# Patient Record
Sex: Female | Born: 1955
Health system: Southern US, Community
[De-identification: ages and names within clinical notes are randomized; demographics above are authoritative.]

## PROBLEM LIST (undated history)

## (undated) DIAGNOSIS — E669 Obesity, unspecified: Secondary | ICD-10-CM

## (undated) DIAGNOSIS — A609 Anogenital herpesviral infection, unspecified: Secondary | ICD-10-CM

## (undated) DIAGNOSIS — I10 Essential (primary) hypertension: Secondary | ICD-10-CM

## (undated) DIAGNOSIS — E119 Type 2 diabetes mellitus without complications: Secondary | ICD-10-CM

## (undated) DIAGNOSIS — F32A Depression, unspecified: Secondary | ICD-10-CM

## (undated) DIAGNOSIS — F329 Major depressive disorder, single episode, unspecified: Secondary | ICD-10-CM

## (undated) DIAGNOSIS — E1169 Type 2 diabetes mellitus with other specified complication: Secondary | ICD-10-CM

## (undated) DIAGNOSIS — E785 Hyperlipidemia, unspecified: Secondary | ICD-10-CM

## (undated) DIAGNOSIS — I341 Nonrheumatic mitral (valve) prolapse: Secondary | ICD-10-CM

## (undated) DIAGNOSIS — G4700 Insomnia, unspecified: Secondary | ICD-10-CM

## (undated) DIAGNOSIS — M5416 Radiculopathy, lumbar region: Secondary | ICD-10-CM

## (undated) DIAGNOSIS — F419 Anxiety disorder, unspecified: Secondary | ICD-10-CM

## (undated) HISTORY — DX: Type 2 diabetes mellitus without complications: E11.9

## (undated) HISTORY — DX: Insomnia, unspecified: G47.00

## (undated) HISTORY — DX: Major depressive disorder, single episode, unspecified: F32.9

## (undated) HISTORY — DX: Essential (primary) hypertension: I10

## (undated) HISTORY — DX: Hyperlipidemia, unspecified: E78.5

## (undated) HISTORY — DX: Radiculopathy, lumbar region: M54.16

## (undated) HISTORY — DX: Nonrheumatic mitral (valve) prolapse: I34.1

## (undated) HISTORY — DX: Depression, unspecified: F32.A

## (undated) HISTORY — DX: Obesity, unspecified: E66.9

## (undated) HISTORY — DX: Anxiety disorder, unspecified: F41.9

## (undated) HISTORY — DX: Anogenital herpesviral infection, unspecified: A60.9

## (undated) HISTORY — DX: Type 2 diabetes mellitus with other specified complication: E11.69

---

## 2000-02-04 ENCOUNTER — Other Ambulatory Visit: Admission: RE | Admit: 2000-02-04 | Discharge: 2000-02-04 | Payer: Self-pay | Admitting: Obstetrics and Gynecology

## 2000-02-13 ENCOUNTER — Encounter: Payer: Self-pay | Admitting: Obstetrics and Gynecology

## 2000-02-13 ENCOUNTER — Encounter: Admission: RE | Admit: 2000-02-13 | Discharge: 2000-02-13 | Payer: Self-pay | Admitting: Obstetrics and Gynecology

## 2000-04-20 ENCOUNTER — Encounter (INDEPENDENT_AMBULATORY_CARE_PROVIDER_SITE_OTHER): Payer: Self-pay

## 2000-04-20 ENCOUNTER — Inpatient Hospital Stay (HOSPITAL_COMMUNITY): Admission: RE | Admit: 2000-04-20 | Discharge: 2000-04-21 | Payer: Self-pay | Admitting: *Deleted

## 2001-10-27 ENCOUNTER — Other Ambulatory Visit: Admission: RE | Admit: 2001-10-27 | Discharge: 2001-10-27 | Payer: Self-pay | Admitting: *Deleted

## 2002-01-18 ENCOUNTER — Encounter: Admission: RE | Admit: 2002-01-18 | Discharge: 2002-01-18 | Payer: Self-pay | Admitting: Family Medicine

## 2002-01-18 ENCOUNTER — Encounter: Payer: Self-pay | Admitting: Family Medicine

## 2002-01-23 ENCOUNTER — Encounter: Payer: Self-pay | Admitting: Family Medicine

## 2002-01-23 ENCOUNTER — Encounter: Admission: RE | Admit: 2002-01-23 | Discharge: 2002-01-23 | Payer: Self-pay | Admitting: Family Medicine

## 2003-06-05 ENCOUNTER — Other Ambulatory Visit: Admission: RE | Admit: 2003-06-05 | Discharge: 2003-06-05 | Payer: Self-pay | Admitting: *Deleted

## 2003-06-15 ENCOUNTER — Encounter: Admission: RE | Admit: 2003-06-15 | Discharge: 2003-06-15 | Payer: Self-pay | Admitting: Family Medicine

## 2003-08-24 ENCOUNTER — Encounter: Admission: RE | Admit: 2003-08-24 | Discharge: 2003-08-24 | Payer: Self-pay | Admitting: Family Medicine

## 2004-03-27 ENCOUNTER — Other Ambulatory Visit: Admission: RE | Admit: 2004-03-27 | Discharge: 2004-03-27 | Payer: Self-pay | Admitting: *Deleted

## 2004-06-24 ENCOUNTER — Encounter: Admission: RE | Admit: 2004-06-24 | Discharge: 2004-06-24 | Payer: Self-pay | Admitting: Family Medicine

## 2005-03-26 ENCOUNTER — Other Ambulatory Visit: Admission: RE | Admit: 2005-03-26 | Discharge: 2005-03-26 | Payer: Self-pay | Admitting: *Deleted

## 2006-03-31 ENCOUNTER — Other Ambulatory Visit: Admission: RE | Admit: 2006-03-31 | Discharge: 2006-03-31 | Payer: Self-pay | Admitting: *Deleted

## 2006-05-14 ENCOUNTER — Encounter: Admission: RE | Admit: 2006-05-14 | Discharge: 2006-05-14 | Payer: Self-pay | Admitting: Family Medicine

## 2007-03-29 ENCOUNTER — Other Ambulatory Visit: Admission: RE | Admit: 2007-03-29 | Discharge: 2007-03-29 | Payer: Self-pay | Admitting: *Deleted

## 2007-05-17 ENCOUNTER — Encounter: Admission: RE | Admit: 2007-05-17 | Discharge: 2007-05-17 | Payer: Self-pay | Admitting: Family Medicine

## 2008-03-28 ENCOUNTER — Other Ambulatory Visit: Admission: RE | Admit: 2008-03-28 | Discharge: 2008-03-28 | Payer: Self-pay | Admitting: Gynecology

## 2008-05-17 ENCOUNTER — Encounter: Admission: RE | Admit: 2008-05-17 | Discharge: 2008-05-17 | Payer: Self-pay | Admitting: Family Medicine

## 2009-05-22 ENCOUNTER — Encounter: Admission: RE | Admit: 2009-05-22 | Discharge: 2009-05-22 | Payer: Self-pay | Admitting: Family Medicine

## 2010-06-04 ENCOUNTER — Encounter: Admission: RE | Admit: 2010-06-04 | Discharge: 2010-06-04 | Payer: Self-pay | Admitting: Family Medicine

## 2010-11-21 NOTE — Op Note (Signed)
[facility]  Patient:    Tina Townsend, Tina Townsend             MRN: [id number] Proc. Date: 04/20/00 Adm. Date:  [id number] Attending:  Collene Schlichter CC:         Harl Bowie, M.D.  Yaakov Guthrie. Shon Hough, M.D.   Operative Report  PREOPERATIVE DIAGNOSES:  Abnormal uterine bleeding, leiomyoma uteri, history of pelvic adhesions, umbilical hernia, diastasis recti.  POSTOPERATIVE DIAGNOSIS:  Abnormal uterine bleeding, leiomyoma uteri, history of pelvic adhesions, umbilical hernia, diastasis recti.  Extensive pelvic adhesions, probable endometriosis.  OPERATIONS:  Total abdominal hysterectomy, bilateral salpingo-oophorectomy, extensive lysis of adhesions.  SURGEON:  Almedia Balls. Randell Patient, M.D.  ANESTHESIA:  General orotracheal.  FIRST ASSISTANT:  Harl Bowie, M.D.  INDICATIONS:  The patient is a 55 year old with the above noted problems who was counseled as to the need for surgery to correct these problems.  She was fully counseled as to the nature of the procedure and the risks involved to include risk of anesthesia, injury to bowel, bladder, blood vessels, ureters, postoperative hemorrhage, infection, recuperation, and possibility of hormone replacement should her ovaries be removed.  She fully understands all these considerations and wishes to proceed on 20 April 2000.  As noted above, she also had the problems with her abdominal wall which will be repaired by Dr. Shon Hough.  OPERATIVE FINDINGS:  Upon entry into the abdomen there were dense adhesions involving loops of bowel to the posterior surface of the uterus and to both adnexal structures.  There was an umbilical hernia noted with separation of the rectus muscles as well.  Exploration of the upper abdomen revealed the liver to be normal.  No abnormalities in the area of the gallbladder, kidneys, spleen, or periaortic areas.  DESCRIPTION OF PROCEDURE:  With the patient under general  anesthesia, prepared and draped in the usual sterile fashion, with a Foley catheter in the bladder, a lower abdominal transverse incision was made after excising a previous surgical scar.  The incision was carried into the peritoneal cavity without difficulty.  Placement of the lateral blades of self-retaining retractor was possible only because of the dense adhesions involving the anterior and posterior surface of the uterus as well as the adnexal structures.  The adhesions were lysed using sharp and blunt dissection with elevation of the uterine fundus which was enlarged to approximately 12-[redacted] weeks gestational size and irregular.  Both ovaries were likewise adherent to loops of bowel. There were areas of what appeared to be endometriosis on the serosal surface of the uterus, particularly in the left adnexal structure area.  After freeing the uterine fundus from the loops of bowel, Heaney clamps were placed at the uteroovarian anastomosis, tubes and round ligaments; these structures were then cut free and ligated with one chromic catgut.  It was impossible to transect the round ligaments with suture ligatures being placed laterally to the area of transection.  Bladder flap was then developed on the anterior surface of the uterus.  Uterine vessels were then skeletonized, clamped, cut, and suture ligated with one chromic catgut.  The uterine fundus was then excised by using Bovie electrocoagulation to transect the lower uterine segment and cervix.  Bleeders were rendered hemostatic with interrupted sutures and vascular clips.  The uterine fundus was excised, and the cervical stump was reapproximated and rendered hemostatic with interrupted sutures of #1 chromic catgut.  Attention was then directed to the ovaries.  Both ovaries were quite adherent to loops of bowel  and it was felt that leaving these intact would impose a threat to the patient later.  Accordingly, sharp and blunt dissection  was used to free the ovaries from their adhesions with the identification of the infundibulopelvic ligaments bilaterally; these structures were clamped, cut, and doubly ligated with one chromic catgut.  The bowel adhesions were carefully inspected to ensure that all areas were hemostatic.  The adhesions on the right had required vascular clips to render hemostasis.  The area was then lavaged with copious amounts of lactated Ringers solution, and the area overlying the cervix was reperitonealized with interrupted sutures of one chromic catgut.  With correct sponge and instrument count and good hemostasis, the peritoneum was closed with continuous suture of 0 Vicryl.  Dr. Shon Hough was to repair the abdominal wall defect at this point.  Estimated blood loss for our portion of the procedure was 400 mL and the patient was in good condition at the time we left the operating room. DD:  04/20/00 TD:  04/20/00 Job: [id number]

## 2010-11-21 NOTE — H&P (Signed)
[facility]  Patient:    Tina Townsend, Tina Townsend                     MRN: [id number] Adm. Date:  04/20/00 Attending:  Almedia Balls. Randell Patient, M.D.                         History and Physical  CHIEF COMPLAINT:  Fibroids, umbilical hernia.  HISTORY OF PRESENT ILLNESS:  Patient is a 55 year old gravida 4, para 2, whose last menstrual period was 18 March 2000.  She had normal Pap smear in July 2001.  She has been seen in Dr. Jonathon Bellows office over the past several months and ultrasound was done in early August 2001 with an indication of multiple myomata present.  The patient has had repeated abnormal menses with heavy flow in which she changes protection at least every 45 minutes.  The endometrium appeared to be normal on the ultrasound done in August and was quite regular except for indentations from fibroids.  She also has a history of marked pelvic adhesions.  She is admitted at this time for total abdominal hysterectomy, possible bilateral salpingo-oophorectomy.  She is also to be operated upon by Dr. Shon Hough for a large umbilical hernia which causes her severe distress with temporary incarceration of bowel.  She has been fully counseled as to the type of procedure to be performed and the risks involved include risks of anesthesia, injury to bowel, bladder, blood vessels, ureters, postoperative hemorrhage, infection, recuperation, possibility of hormone replacement, should her ovaries be removed.  PAST MEDICAL HISTORY:  Includes ectopic pregnancy, right, in 1987, left partial salpingectomy in 1995 by Dr. Dewaine Conger with findings of extensive pelvic adhesions.  She has no serious medical problems except for heart murmur, for which she takes antibiotics prior to operative procedures.  ALLERGIES:  She is allergic to no medications and takes no medications regularly.  FAMILY HISTORY:  Mother with hypertension, mother and two siblings with diabetes mellitus.  REVIEW  OF SYSTEMS:  HEENT:  Negative.  CARDIORESPIRATORY:  As noted above. GASTROINTESTINAL:  Negative.  GENITOURINARY:  As noted above.  PHYSICAL EXAMINATION:  VITAL SIGNS:  Height 5 feet 4-1/4 inches, weight 164 pounds, blood pressure 118/76, pulse 80, respirations 18.  GENERAL:  Well-developed black female in no acute distress.  HEENT:  Within normal limits.  NECK:  Supple without masses, adenopathy, or bruits.  HEART:  Regular rate and rhythm without murmurs.  LUNGS:  Clear to P&A.  BREASTS:  Examined sitting and lying without mass.  Axillae negative.  ABDOMEN:  Flat and soft with irregular mass effect in the lower abdomen approximately two finger breadths below the umbilicus.  PELVIC:  External genitalia:  Bartholins, urethral, and Skenes glands within normal limits.  Cervix slightly inflamed.  Uterus _________ , and approximately [redacted] weeks gestational size.  With straining the patient demonstrates defect in the umbilicus representing an umbilical hernia in this area.  It is not possible to palpate adnexal masses, but ultrasound recently has been normal.  Anterior and posterior cul-de-sac exam is confirmatory.  EXTREMITIES:  Within normal limits.  CENTRAL NERVOUS SYSTEM:  Grossly intact.  SKIN:  Without suspicious lesions.  IMPRESSION: 1. Leiomyomata uteri. 2. Abnormal uterine bleeding. 3. History of pelvic adhesions. 4. Umbilical hernia.  DISPOSITION:  As noted above. DD:  04/08/00 TD:  04/08/00 Job: [id number]

## 2010-11-21 NOTE — Op Note (Signed)
[place]  Patient:    Tina Townsend, Tina Townsend             MRN: [id number] Adm. Date:  [id number] Attending:  Collene Schlichter                           Operative Report  INDICATIONS:  This is a 55 year old lady who has increased abdominal dermatochalasis, as well as repair of 4+ diastasis recti and umbilical hernia.  PROCEDURES PERFORMED: 1. Repair of diastasis recti. 2. Repair of umbilical hernia with Prolene mesh.  SURGEON:  Yaakov Guthrie. Shon Hough, M.D.  ANESTHESIA:  General.  The patient is also having an associate procedure done by Dr. Randell Patient for abdominal hysterectomy.  DESCRIPTION OF PROCEDURE:  The patient underwent general anesthesia and proper prep after the abdominal hysterectomy portion was done. I then took over.  I extended the transverse incision through laterally on each side and then took a flap of tissue up to the umbilicus. The umbilicus was then released on its pedicle and an obvious umbilical hernia was present. The sac was dissected and the umbilical hernia contents were examined showing omentum. This was placed back into the intra-abdominal cavity. The fascia edges were then sharpened with Metzenbaum scissors. The area was prepared with a 3 x 3 sheeting of Prolene mesh, which was tripled on itself, and plicated with multiple sutures of 0 Surgilon.  Next, the severe diastasis recti was repaired from above the umbilicus down to the suprapubic area using a running locking suture of #1 Prolene. The wound was irrigated after proper hemostasis. The umbilicus was then placed back down into the fascia with a #2 Surgilon suture. Excess tissue of the skin was excised and repair was done with multiple sutures of 2-0 Monocryl x 2 layers in the subcu plane, and then a rather subcuticular stitch of 3-0 Monocryl. The wounds were drained with a #10 Blake drain, which was placed in the depths of the wound and brought out through the  suprapubic hair and secured with 3-0 Prolene. The wounds were cleansed. Steri-Strips and a soft dressing applied to all the areas, as well as an abdominal support.  She withstood the procedures very well and was taken to recovery in good condition.  ESTIMATED BLOOD LOSS:  150 cc.  COMPLICATIONS:  None.DD:  04/20/00 TD:  04/20/00 Job: [id number]

## 2010-11-21 NOTE — Discharge Summary (Signed)
[facility]  Patient:    Tina Townsend, Tina Townsend             MRN: [id number] Adm. Date:  [id number] Disc. Date: [id number] Attending:  Collene Schlichter                           Discharge Summary  HISTORY:  The patient is a 55 year old with abnormal uterine bleeding, uterine enlargement secondary to probably myomata, history of pelvic adhesions, umbilical hernia, and diastasis recti, who was admitted on April 20, 2000, for repair of these problems.  The remainder of her history and physical are as previously dictated.  LABORATORY DATA:  Preoperative hemoglobin 9.9, normal electrolytes, normal urinalysis.  Postoperative hemoglobin 7.4.  HOSPITAL COURSE:  The patient was taken to the operating room on the morning of April 20, 2000, at which time, abdominal supracervical hysterectomy, bilateral salpingo-oophorectomy, extensive lysis of adhesions, repair of umbilical hernia, and diastasis recti by Dr. Shon Hough were performed.  The patient did well postoperatively.  Diet and ambulation were progressed over the evening of October 16 and early morning of October 17.  On the morning of April 21, 2000, she was afebrile and experiencing no problems.  It was felt that she could be discharged at this time.  FINAL DIAGNOSES: 1. Leiomyomata uteri. 2. Abnormal uterine bleeding with anemia secondary to above. 3. Extensive pelvic adhesions. 4. Umbilical hernia. 5. Diastasis recti.  OPERATIONS:  Abdominal supracervical hysterectomy, bilateral salpingo-oophorectomy, extensive lysis of adhesions, repair of umbilical hernia, repair of diastasis recti.  Pathology report is unavailable at the time of dictation.  DISPOSITION:  Discharged home to return to our office in approximately two weeks for follow-up.  She is instructed to gradually progress her activities over several weeks at home and to limit lifting and driving.  She has been instructed by Dr.  Shon Hough to return to his office in approximately two days for follow-up.  DISCHARGE MEDICATIONS: 1. She is given a prescription for Tylox, generic, #30 to be taken 1-2 q.4-6h.    p.r.n. pain. 2. Vivelle patch 0.05 mg to be changed twice weekly.  DISCHARGE INSTRUCTIONS:  She is to call for any problems. DD:  04/21/00 TD:  04/21/00 Job: [id number]

## 2011-05-05 ENCOUNTER — Other Ambulatory Visit: Payer: Self-pay | Admitting: Family Medicine

## 2011-05-05 DIAGNOSIS — Z1231 Encounter for screening mammogram for malignant neoplasm of breast: Secondary | ICD-10-CM

## 2011-06-11 ENCOUNTER — Ambulatory Visit
Admission: RE | Admit: 2011-06-11 | Discharge: 2011-06-11 | Disposition: A | Discharge status: Home / Self Care | Payer: 59 | Source: Ambulatory Visit | Attending: Family Medicine | Admitting: Family Medicine

## 2011-06-11 DIAGNOSIS — Z1231 Encounter for screening mammogram for malignant neoplasm of breast: Secondary | ICD-10-CM

## 2011-06-16 ENCOUNTER — Other Ambulatory Visit: Payer: Self-pay | Admitting: Family Medicine

## 2011-06-16 DIAGNOSIS — R928 Other abnormal and inconclusive findings on diagnostic imaging of breast: Secondary | ICD-10-CM

## 2011-07-01 ENCOUNTER — Ambulatory Visit
Admission: RE | Admit: 2011-07-01 | Discharge: 2011-07-01 | Disposition: A | Discharge status: Home / Self Care | Payer: 59 | Source: Ambulatory Visit | Attending: Family Medicine | Admitting: Family Medicine

## 2011-07-01 DIAGNOSIS — R928 Other abnormal and inconclusive findings on diagnostic imaging of breast: Secondary | ICD-10-CM

## 2011-11-26 ENCOUNTER — Other Ambulatory Visit: Payer: Self-pay | Admitting: Family Medicine

## 2011-11-26 DIAGNOSIS — Z09 Encounter for follow-up examination after completed treatment for conditions other than malignant neoplasm: Secondary | ICD-10-CM

## 2012-01-01 ENCOUNTER — Other Ambulatory Visit: Payer: 59

## 2012-03-02 ENCOUNTER — Ambulatory Visit
Admission: RE | Admit: 2012-03-02 | Discharge: 2012-03-02 | Disposition: A | Discharge status: Home / Self Care | Payer: 59 | Source: Ambulatory Visit | Attending: Family Medicine | Admitting: Family Medicine

## 2012-03-02 DIAGNOSIS — Z09 Encounter for follow-up examination after completed treatment for conditions other than malignant neoplasm: Secondary | ICD-10-CM

## 2012-08-25 ENCOUNTER — Other Ambulatory Visit: Payer: Self-pay | Admitting: Family Medicine

## 2012-08-25 DIAGNOSIS — Z1231 Encounter for screening mammogram for malignant neoplasm of breast: Secondary | ICD-10-CM

## 2012-09-16 ENCOUNTER — Ambulatory Visit
Admission: RE | Admit: 2012-09-16 | Discharge: 2012-09-16 | Disposition: A | Discharge status: Home / Self Care | Payer: 59 | Source: Ambulatory Visit | Attending: Family Medicine | Admitting: Family Medicine

## 2012-09-16 DIAGNOSIS — Z1231 Encounter for screening mammogram for malignant neoplasm of breast: Secondary | ICD-10-CM

## 2013-01-10 DIAGNOSIS — F331 Major depressive disorder, recurrent, moderate: Secondary | ICD-10-CM

## 2013-01-10 DIAGNOSIS — F909 Attention-deficit hyperactivity disorder, unspecified type: Secondary | ICD-10-CM

## 2013-01-20 DIAGNOSIS — R41842 Visuospatial deficit: Secondary | ICD-10-CM

## 2013-01-20 DIAGNOSIS — F331 Major depressive disorder, recurrent, moderate: Secondary | ICD-10-CM

## 2013-01-20 DIAGNOSIS — R41840 Attention and concentration deficit: Secondary | ICD-10-CM

## 2013-03-23 ENCOUNTER — Other Ambulatory Visit: Payer: Self-pay | Admitting: Family Medicine

## 2013-03-23 DIAGNOSIS — IMO0002 Reserved for concepts with insufficient information to code with codable children: Secondary | ICD-10-CM

## 2013-04-15 ENCOUNTER — Other Ambulatory Visit: Payer: 59

## 2013-04-23 ENCOUNTER — Ambulatory Visit
Admission: RE | Admit: 2013-04-23 | Discharge: 2013-04-23 | Disposition: A | Discharge status: Home / Self Care | Payer: 59 | Source: Ambulatory Visit | Attending: Family Medicine | Admitting: Family Medicine

## 2013-04-23 DIAGNOSIS — IMO0002 Reserved for concepts with insufficient information to code with codable children: Secondary | ICD-10-CM

## 2013-05-10 DIAGNOSIS — G5600 Carpal tunnel syndrome, unspecified upper limb: Secondary | ICD-10-CM | POA: Insufficient documentation

## 2013-05-10 DIAGNOSIS — M47812 Spondylosis without myelopathy or radiculopathy, cervical region: Secondary | ICD-10-CM | POA: Insufficient documentation

## 2013-05-10 HISTORY — DX: Carpal tunnel syndrome, unspecified upper limb: G56.00

## 2013-05-10 HISTORY — DX: Spondylosis without myelopathy or radiculopathy, cervical region: M47.812

## 2013-06-08 DIAGNOSIS — M4802 Spinal stenosis, cervical region: Secondary | ICD-10-CM

## 2013-06-08 HISTORY — DX: Spinal stenosis, cervical region: M48.02

## 2014-03-29 ENCOUNTER — Other Ambulatory Visit: Payer: Self-pay | Admitting: Family Medicine

## 2014-03-29 DIAGNOSIS — Z139 Encounter for screening, unspecified: Secondary | ICD-10-CM

## 2014-04-04 ENCOUNTER — Other Ambulatory Visit: Payer: Self-pay | Admitting: Family Medicine

## 2014-04-04 DIAGNOSIS — Z87891 Personal history of nicotine dependence: Secondary | ICD-10-CM

## 2014-04-05 ENCOUNTER — Ambulatory Visit
Admission: RE | Admit: 2014-04-05 | Discharge: 2014-04-05 | Disposition: A | Discharge status: Home / Self Care | Payer: No Typology Code available for payment source | Source: Ambulatory Visit | Attending: Family Medicine | Admitting: Family Medicine

## 2014-04-05 ENCOUNTER — Encounter (INDEPENDENT_AMBULATORY_CARE_PROVIDER_SITE_OTHER): Payer: Self-pay

## 2014-04-05 DIAGNOSIS — Z87891 Personal history of nicotine dependence: Secondary | ICD-10-CM

## 2014-04-19 ENCOUNTER — Encounter: Payer: Self-pay | Admitting: *Deleted

## 2014-10-03 ENCOUNTER — Other Ambulatory Visit: Payer: Self-pay | Admitting: Specialist

## 2014-10-03 DIAGNOSIS — N189 Chronic kidney disease, unspecified: Secondary | ICD-10-CM

## 2014-10-08 ENCOUNTER — Ambulatory Visit
Admission: RE | Admit: 2014-10-08 | Discharge: 2014-10-08 | Disposition: A | Discharge status: Home / Self Care | Payer: 59 | Source: Ambulatory Visit | Attending: Specialist | Admitting: Specialist

## 2014-10-08 DIAGNOSIS — N189 Chronic kidney disease, unspecified: Secondary | ICD-10-CM

## 2015-06-11 ENCOUNTER — Other Ambulatory Visit: Payer: Self-pay | Admitting: Family Medicine

## 2015-06-11 DIAGNOSIS — R911 Solitary pulmonary nodule: Secondary | ICD-10-CM

## 2015-07-03 ENCOUNTER — Ambulatory Visit
Admission: RE | Admit: 2015-07-03 | Discharge: 2015-07-03 | Disposition: A | Discharge status: Home / Self Care | Payer: 59 | Source: Ambulatory Visit | Attending: Family Medicine | Admitting: Family Medicine

## 2015-07-03 DIAGNOSIS — R911 Solitary pulmonary nodule: Secondary | ICD-10-CM

## 2016-02-18 ENCOUNTER — Encounter: Payer: Self-pay | Admitting: Cardiovascular Disease

## 2016-02-21 ENCOUNTER — Encounter: Payer: Self-pay | Admitting: Cardiovascular Disease

## 2016-02-21 ENCOUNTER — Ambulatory Visit (INDEPENDENT_AMBULATORY_CARE_PROVIDER_SITE_OTHER): Payer: 59 | Admitting: Cardiovascular Disease

## 2016-02-21 VITALS — BP 116/72 | HR 92 | Ht 64.0 in | Wt 189.8 lb

## 2016-02-21 DIAGNOSIS — E785 Hyperlipidemia, unspecified: Secondary | ICD-10-CM | POA: Diagnosis not present

## 2016-02-21 DIAGNOSIS — F329 Major depressive disorder, single episode, unspecified: Secondary | ICD-10-CM | POA: Insufficient documentation

## 2016-02-21 DIAGNOSIS — E039 Hypothyroidism, unspecified: Secondary | ICD-10-CM

## 2016-02-21 DIAGNOSIS — E669 Obesity, unspecified: Secondary | ICD-10-CM | POA: Insufficient documentation

## 2016-02-21 DIAGNOSIS — E119 Type 2 diabetes mellitus without complications: Secondary | ICD-10-CM | POA: Insufficient documentation

## 2016-02-21 DIAGNOSIS — F32A Depression, unspecified: Secondary | ICD-10-CM | POA: Insufficient documentation

## 2016-02-21 DIAGNOSIS — R0789 Other chest pain: Secondary | ICD-10-CM

## 2016-02-21 DIAGNOSIS — E1169 Type 2 diabetes mellitus with other specified complication: Secondary | ICD-10-CM

## 2016-02-21 DIAGNOSIS — I341 Nonrheumatic mitral (valve) prolapse: Secondary | ICD-10-CM

## 2016-02-21 HISTORY — DX: Type 2 diabetes mellitus without complications: E11.9

## 2016-02-21 HISTORY — DX: Type 2 diabetes mellitus with other specified complication: E11.69

## 2016-02-21 HISTORY — DX: Nonrheumatic mitral (valve) prolapse: I34.1
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
## 2016-02-21 NOTE — Progress Notes (Signed)
Instructions given

## 2016-02-21 NOTE — Patient Instructions (Addendum)
Medication Instructions:  START ASPIRIN 81 MG DAILY  Labwork: FASTING LP/TSH/FT4 AT [facility]  Testing/Procedures: Your physician has requested that you have en exercise stress myoview. For further information please visit [email or web address]. Please follow instruction sheet, as given.  Follow-Up: Your physician recommends that you schedule a follow-up appointment in: [place]  If you need a refill on your cardiac medications before your next appointment, please call your pharmacy.

## 2016-02-29 LAB — LIPID PANEL
CHOL/HDL RATIO: 2.5 ratio (ref <=5.0)
Cholesterol: 139 mg/dL (ref 125–200)
HDL: 56 mg/dL (ref >=46)
LDL Cholesterol: 71 mg/dL (ref <130)
Triglycerides: 59 mg/dL (ref <150)
VLDL: 12 mg/dL (ref <30)

## 2016-02-29 LAB — TSH: TSH: 4.76 m[IU]/L — AB

## 2016-02-29 LAB — T4, FREE: FREE T4: 1 ng/dL (ref 0.8–1.8)

## 2016-03-04 ENCOUNTER — Telehealth (HOSPITAL_COMMUNITY): Payer: Self-pay

## 2016-03-04 NOTE — Telephone Encounter (Signed)
Encounter complete. 

## 2016-03-05 ENCOUNTER — Telehealth (HOSPITAL_COMMUNITY): Payer: Self-pay

## 2016-03-05 NOTE — Telephone Encounter (Signed)
Encounter complete. 

## 2016-03-06 ENCOUNTER — Ambulatory Visit (HOSPITAL_COMMUNITY)
Admission: RE | Admit: 2016-03-06 | Discharge: 2016-03-06 | Disposition: A | Discharge status: Home / Self Care | Payer: 59 | Source: Ambulatory Visit | Attending: Cardiovascular Disease | Admitting: Cardiovascular Disease

## 2016-03-06 DIAGNOSIS — R5383 Other fatigue: Secondary | ICD-10-CM | POA: Diagnosis not present

## 2016-03-06 DIAGNOSIS — R002 Palpitations: Secondary | ICD-10-CM | POA: Insufficient documentation

## 2016-03-06 DIAGNOSIS — Z8249 Family history of ischemic heart disease and other diseases of the circulatory system: Secondary | ICD-10-CM | POA: Insufficient documentation

## 2016-03-06 DIAGNOSIS — I10 Essential (primary) hypertension: Secondary | ICD-10-CM | POA: Insufficient documentation

## 2016-03-06 DIAGNOSIS — R0609 Other forms of dyspnea: Secondary | ICD-10-CM | POA: Insufficient documentation

## 2016-03-06 DIAGNOSIS — E039 Hypothyroidism, unspecified: Secondary | ICD-10-CM | POA: Insufficient documentation

## 2016-03-06 DIAGNOSIS — E119 Type 2 diabetes mellitus without complications: Secondary | ICD-10-CM | POA: Insufficient documentation

## 2016-03-06 DIAGNOSIS — E669 Obesity, unspecified: Secondary | ICD-10-CM | POA: Insufficient documentation

## 2016-03-06 DIAGNOSIS — R0789 Other chest pain: Secondary | ICD-10-CM | POA: Diagnosis not present

## 2016-03-06 DIAGNOSIS — Z6832 Body mass index (BMI) 32.0-32.9, adult: Secondary | ICD-10-CM | POA: Insufficient documentation

## 2016-03-06 DIAGNOSIS — Z87891 Personal history of nicotine dependence: Secondary | ICD-10-CM | POA: Diagnosis not present

## 2016-03-06 LAB — MYOCARDIAL PERFUSION IMAGING
CHL CUP NUCLEAR SDS: 0
CSEPED: 6 min
CSEPPHR: 150 {beats}/min
Estimated workload: 7 METS
Exercise duration (sec): 0 s
LV sys vol: 13 mL
LVDIAVOL: 55 mL (ref 46–106)
MPHR: 161 {beats}/min
NUC STRESS TID: 1.32
Percent HR: 95 %
RPE: 17
Rest HR: 87 {beats}/min
SRS: 0
SSS: 0

## 2016-03-06 MED ORDER — TECHNETIUM TC 99M TETROFOSMIN IV KIT
29.0000 | PACK | Freq: Once | INTRAVENOUS | Status: AC | PRN
  Administered 2016-03-06: 29 via INTRAVENOUS
  Filled 2016-03-06: qty 29

## 2016-03-06 MED ORDER — TECHNETIUM TC 99M TETROFOSMIN IV KIT
10.2000 | PACK | Freq: Once | INTRAVENOUS | Status: AC | PRN
  Administered 2016-03-06: 10.2 via INTRAVENOUS
  Filled 2016-03-06: qty 10

## 2016-03-10 ENCOUNTER — Telehealth: Payer: Self-pay | Admitting: *Deleted

## 2016-03-10 NOTE — Telephone Encounter (Signed)
-----   Message from Skeet Latch, MD sent at 03/10/2016  4:05 PM EDT ----- Normal stress test

## 2016-03-10 NOTE — Telephone Encounter (Signed)
Advised patient of stress test results  Patient did want Dr Oval Linsey to know that she had started on Glipizide around the time she started having the chest pains. She has stopped the Glipizide and is no longer having chest pains.

## 2016-04-03 ENCOUNTER — Ambulatory Visit: Payer: 59 | Admitting: Cardiovascular Disease

## 2016-04-17 ENCOUNTER — Ambulatory Visit (INDEPENDENT_AMBULATORY_CARE_PROVIDER_SITE_OTHER): Payer: 59 | Admitting: Cardiovascular Disease

## 2016-04-17 ENCOUNTER — Encounter: Payer: Self-pay | Admitting: Cardiovascular Disease

## 2016-04-17 VITALS — BP 123/77 | HR 95 | Ht 64.0 in | Wt 189.8 lb

## 2016-04-17 DIAGNOSIS — R0789 Other chest pain: Secondary | ICD-10-CM

## 2016-04-17 DIAGNOSIS — I10 Essential (primary) hypertension: Secondary | ICD-10-CM | POA: Diagnosis not present

## 2016-04-17 DIAGNOSIS — E7800 Pure hypercholesterolemia, unspecified: Secondary | ICD-10-CM | POA: Diagnosis not present

## 2016-04-17 NOTE — Progress Notes (Signed)
Cardiology Office Note   Date:  04/17/2016   ID:  Lashala, Fohl [birth year], MRN [id number]  PCP:  Marda Stalker, PA-C  Cardiologist:   Skeet Latch, MD   Chief Complaint  Patient presents with  . Follow-up    edema; in ankles if pt does not take a lasix.     History of Present Illness: Tina Townsend is a 60 y.o. female with hypertension and hyperlipidemia who presents for follow up on chest pressure.  Tina Townsend was seen 02/2016 with chest pressure that had been ongoing for the last month. She was referred for exercise Myoview 03/06/16 that was negative for ischemia. LVEF was 76%.  Since that appointment she has been feeling well.  She has very rare episodes of chest pressure.  She stopped taking glipizide and switched to metformin.  She thinks that this may have been the cause of her symptoms.  She also reports having a lot of stress from dealing with her husband who is a disabled veteran.  She hasn't noted any lower extremity edema, orthopnea, or PND. She was also started on levothyroxine for subclinical hypothyroidism.   Past Medical History:  Diagnosis Date  . Anxiety   . Depression   . Diabetes mellitus type 2 in obese ([place]) 02/21/2016  . Diabetes mellitus without complication ([place])   . HSV (herpes simplex virus) anogenital infection   . Hyperlipidemia   . Hypertension   . Insomnia   . Mitral valve prolapse 02/21/2016    No past surgical history on file.   Current Outpatient Prescriptions  Medication Sig Dispense Refill  . ALPRAZolam (XANAX) 0.5 MG tablet Take 0.5 mg by mouth at bedtime as needed for anxiety.    Marland Kitchen aspirin EC 81 MG tablet Take 81 mg by mouth daily.    Marland Kitchen atorvastatin (LIPITOR) 40 MG tablet Take 40 mg by mouth daily.    . BuPROPion HCl (WELLBUTRIN XL PO) Take 450 mg by mouth every morning.    . cholecalciferol (VITAMIN D) 1000 units tablet Take 1,000 Units by mouth daily.    . furosemide (LASIX) 20 MG tablet Take 20 mg by mouth  daily.    Marland Kitchen lisinopril-hydrochlorothiazide (PRINZIDE,ZESTORETIC) 20-12.5 MG per tablet Take 1 tablet by mouth daily.    . Polyethylene Glycol 3350 POWD Take by mouth as needed.    . senna (SENOKOT) 8.6 MG tablet Take 3 tablets by mouth daily.    . Urea 39 % CREA Apply topically 2 (two) times daily.    . valACYclovir (VALTREX) 1000 MG tablet Take 1,000 mg by mouth 2 (two) times daily.     No current facility-administered medications for this visit.     Allergies:   Glipizide    Social History:  The patient  reports that she has quit smoking. She does not have any smokeless tobacco history on file. She reports that she does not drink alcohol or use drugs.   Family History:  The patient's family history includes COPD in her mother; Cancer in her mother and sister; Diabetes in her mother; Heart disease in her mother; Hypertension in her sister.    ROS:  Please see the history of present illness.   Otherwise, review of systems are positive for none.   All other systems are reviewed and negative.    PHYSICAL EXAM: VS:  BP 123/77   Pulse 95   Ht 5\' 4"  (1.626 m)   Wt 189 lb 12.8 oz (86.1 kg)   BMI  32.58 kg/m  , BMI Body mass index is 32.58 kg/m. GENERAL:  Well appearing HEENT:  Pupils equal round and reactive, fundi not visualized, oral mucosa unremarkable NECK:  No jugular venous distention, waveform within normal limits, carotid upstroke brisk and symmetric, no bruits LYMPHATICS:  No cervical adenopathy LUNGS:  Clear to auscultation bilaterally HEART:  RRR.  PMI not displaced or sustained,S1 and S2 within normal limits, no S3, no S4, no clicks, no rubs, no murmurs ABD:  Flat, positive bowel sounds normal in frequency in pitch, no bruits, no rebound, no guarding, no midline pulsatile mass, no hepatomegaly, no splenomegaly EXT:  2 plus pulses throughout, no edema, no cyanosis no clubbing SKIN:  No rashes no nodules NEURO:  Cranial nerves II through XII grossly intact, motor grossly  intact throughout PSYCH:  Cognitively intact, oriented to person place and time    EKG:  EKG is not ordered today. The ekg ordered 02/21/16 demonstrates sinus rhythm. PACs. Rate 92 bpm. Right axis deviation. Cannot rule out prior septal MI.  Exercise Myoveiw 03/06/16:  Nuclear stress EF: 76%.  The left ventricular ejection fraction is hyperdynamic (>65%).  Blood pressure demonstrated a normal response to exercise.  The study is normal.  This is a low risk study.  Recent Labs: 02/28/2016: TSH 4.76   02/19/16: WBC 9.9, hemoglobin 13.5, hematocrit 41.9, platelets 247 TSH 5.29 Hemoglobin A1c 6.2% Sodium 143, potassium 4.4, BUN 17, creatinine 1.29 AST 18, ALT 39  Lipid Panel    Component Value Date/Time   CHOL 139 02/28/2016 0910   TRIG 59 02/28/2016 0910   HDL 56 02/28/2016 0910   CHOLHDL 2.5 02/28/2016 0910   VLDL 12 02/28/2016 0910   LDLCALC 71 02/28/2016 0910      Wt Readings from Last 3 Encounters:  04/17/16 189 lb 12.8 oz (86.1 kg)  03/06/16 189 lb (85.7 kg)  02/21/16 189 lb 12.8 oz (86.1 kg)      ASSESSMENT AND PLAN:  # Chest tightness:  Stress test was negative for ischemia.  Given her hypertension and hyperlipidemia she was started on aspirin 81 mg daily. her diabetes,   # Mitral valve prolapse: Tina Townsend reports a history of mitral valve prolapse. She does not have any evidence of heart failure on exam and it does not sound like she has any mitral regurgitation. Mitral valve prolapse is associated with both chest pain and palpitations.  # Hypertension: Blood pressure is well-controlled.  Continue lisinopril and hydrochlorothiazide.  # Hyperlipidemia: Continue atorvastatin. LDL 71 02/2016.   Current medicines are reviewed at length with the patient today.  The patient does not have concerns regarding medicines.  The following changes have been made:  None   Labs/ tests ordered today include:   No orders of the defined types were placed in this  encounter.    Disposition:   FU with Sura Canul C. Oval Linsey, MD, [facility] as needed.    This note was written with the assistance of speech recognition software.  Please excuse any transcriptional errors.  Signed, Jonhatan Hearty C. Oval Linsey, MD, [facility]  04/17/2016 9:10 AM    [place]

## 2016-04-17 NOTE — Patient Instructions (Signed)
Your stress test was normal.  Please call if you have any new symptoms such as increased chest pressure, shortness of breath or swelling.

## 2016-09-07 ENCOUNTER — Other Ambulatory Visit: Payer: Self-pay | Admitting: Family Medicine

## 2016-09-07 DIAGNOSIS — R911 Solitary pulmonary nodule: Secondary | ICD-10-CM

## 2016-09-18 ENCOUNTER — Ambulatory Visit
Admission: RE | Admit: 2016-09-18 | Discharge: 2016-09-18 | Disposition: A | Discharge status: Home / Self Care | Payer: 59 | Source: Ambulatory Visit | Attending: Family Medicine | Admitting: Family Medicine

## 2016-09-18 DIAGNOSIS — R911 Solitary pulmonary nodule: Secondary | ICD-10-CM

## 2017-12-30 DIAGNOSIS — M6530 Trigger finger, unspecified finger: Secondary | ICD-10-CM

## 2017-12-30 DIAGNOSIS — M5126 Other intervertebral disc displacement, lumbar region: Secondary | ICD-10-CM | POA: Insufficient documentation

## 2017-12-30 HISTORY — DX: Trigger finger, unspecified finger: M65.30

## 2017-12-30 HISTORY — DX: Other intervertebral disc displacement, lumbar region: M51.26

## 2019-07-20 DIAGNOSIS — M7541 Impingement syndrome of right shoulder: Secondary | ICD-10-CM | POA: Insufficient documentation

## 2019-07-20 DIAGNOSIS — M7711 Lateral epicondylitis, right elbow: Secondary | ICD-10-CM

## 2019-07-20 HISTORY — DX: Impingement syndrome of right shoulder: M75.41

## 2019-07-20 HISTORY — DX: Lateral epicondylitis, right elbow: M77.11

## 2020-06-26 DIAGNOSIS — I10 Essential (primary) hypertension: Secondary | ICD-10-CM

## 2020-06-26 DIAGNOSIS — E875 Hyperkalemia: Secondary | ICD-10-CM

## 2020-06-26 DIAGNOSIS — E559 Vitamin D deficiency, unspecified: Secondary | ICD-10-CM | POA: Insufficient documentation

## 2020-06-26 HISTORY — DX: Essential (primary) hypertension: I10

## 2020-06-26 HISTORY — DX: Hyperkalemia: E87.5

## 2020-06-26 HISTORY — DX: Hypercalcemia: E83.52

## 2020-06-26 HISTORY — DX: Vitamin D deficiency, unspecified: E55.9

## 2020-09-24 DIAGNOSIS — M7062 Trochanteric bursitis, left hip: Secondary | ICD-10-CM

## 2020-09-24 DIAGNOSIS — M7061 Trochanteric bursitis, right hip: Secondary | ICD-10-CM

## 2020-09-24 HISTORY — DX: Trochanteric bursitis, left hip: M70.62

## 2020-09-24 HISTORY — DX: Trochanteric bursitis, right hip: M70.61

## 2020-10-10 DIAGNOSIS — F411 Generalized anxiety disorder: Secondary | ICD-10-CM

## 2020-10-10 HISTORY — DX: Generalized anxiety disorder: F41.1

## 2020-10-31 DIAGNOSIS — R911 Solitary pulmonary nodule: Secondary | ICD-10-CM

## 2020-10-31 DIAGNOSIS — K5900 Constipation, unspecified: Secondary | ICD-10-CM | POA: Insufficient documentation

## 2020-10-31 DIAGNOSIS — M25559 Pain in unspecified hip: Secondary | ICD-10-CM | POA: Insufficient documentation

## 2020-10-31 DIAGNOSIS — F909 Attention-deficit hyperactivity disorder, unspecified type: Secondary | ICD-10-CM | POA: Insufficient documentation

## 2020-10-31 DIAGNOSIS — J329 Chronic sinusitis, unspecified: Secondary | ICD-10-CM

## 2020-10-31 DIAGNOSIS — Z860100 Personal history of colon polyps, unspecified: Secondary | ICD-10-CM

## 2020-10-31 DIAGNOSIS — E039 Hypothyroidism, unspecified: Secondary | ICD-10-CM

## 2020-10-31 DIAGNOSIS — K219 Gastro-esophageal reflux disease without esophagitis: Secondary | ICD-10-CM | POA: Insufficient documentation

## 2020-10-31 DIAGNOSIS — M48061 Spinal stenosis, lumbar region without neurogenic claudication: Secondary | ICD-10-CM

## 2020-10-31 HISTORY — DX: Hypothyroidism, unspecified: E03.9

## 2020-10-31 HISTORY — DX: Personal history of colon polyps, unspecified: Z86.0100

## 2020-10-31 HISTORY — DX: Pain in unspecified hip: M25.559

## 2020-10-31 HISTORY — DX: Personal history of colonic polyps: Z86.010

## 2020-10-31 HISTORY — DX: Attention-deficit hyperactivity disorder, unspecified type: F90.9

## 2020-10-31 HISTORY — DX: Solitary pulmonary nodule: R91.1

## 2020-10-31 HISTORY — DX: Chronic sinusitis, unspecified: J32.9

## 2020-10-31 HISTORY — DX: Spinal stenosis, lumbar region without neurogenic claudication: M48.061

## 2020-10-31 HISTORY — DX: Gastro-esophageal reflux disease without esophagitis: K21.9

## 2020-12-16 ENCOUNTER — Other Ambulatory Visit: Payer: Self-pay

## 2020-12-16 ENCOUNTER — Ambulatory Visit
Admission: RE | Admit: 2020-12-16 | Discharge: 2020-12-16 | Disposition: A | Discharge status: Home / Self Care | Payer: 59 | Source: Ambulatory Visit | Attending: Family Medicine | Admitting: Family Medicine

## 2020-12-16 ENCOUNTER — Other Ambulatory Visit: Payer: Self-pay | Admitting: Family Medicine

## 2020-12-16 DIAGNOSIS — R059 Cough, unspecified: Secondary | ICD-10-CM

## 2020-12-17 ENCOUNTER — Encounter: Payer: Self-pay | Admitting: Cardiovascular Disease

## 2021-02-03 DIAGNOSIS — Z8616 Personal history of COVID-19: Secondary | ICD-10-CM | POA: Insufficient documentation

## 2021-02-03 HISTORY — DX: Personal history of COVID-19: Z86.16

## 2021-04-16 ENCOUNTER — Other Ambulatory Visit: Payer: Self-pay

## 2021-04-16 ENCOUNTER — Other Ambulatory Visit (INDEPENDENT_AMBULATORY_CARE_PROVIDER_SITE_OTHER): Payer: 59

## 2021-04-16 ENCOUNTER — Ambulatory Visit (INDEPENDENT_AMBULATORY_CARE_PROVIDER_SITE_OTHER): Payer: 59 | Admitting: Physician Assistant

## 2021-04-16 ENCOUNTER — Encounter: Payer: Self-pay | Admitting: Physician Assistant

## 2021-04-16 VITALS — BP 128/79 | HR 87 | Resp 18 | Ht 63.0 in | Wt 186.0 lb

## 2021-04-16 DIAGNOSIS — R413 Other amnesia: Secondary | ICD-10-CM | POA: Diagnosis not present

## 2021-04-16 LAB — TSH: TSH: 3.11 u[IU]/mL (ref 0.35–5.50)

## 2021-04-16 LAB — VITAMIN B12: Vitamin B-12: 268 pg/mL (ref 211–911)
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
## 2021-04-16 NOTE — Patient Instructions (Addendum)
It was a pleasure to see you today at our office.   Recommendations:  MRI of the brain, the radiology office will call you to arrange you appointment Check labs today Follow up pending the results of the MRI  6 months follow up   [place]: 1. Continue to exercise (Recommend 30 minutes of walking everyday, or 3 hours every week) 2. Increase social interactions - continue going to [place] and enjoy social gatherings with friends and family 3. Eat healthy, avoid fried foods and eat more fruits and vegetables 4. Maintain adequate blood pressure, blood sugar, and blood cholesterol level. Reducing the risk of stroke and cardiovascular disease also helps promoting better memory. 5. Avoid stressful situations. Live a simple life and avoid aggravations. Organize your time and prepare for the next day in anticipation. 6. Sleep well, avoid any interruptions of sleep and avoid any distractions in the bedroom that may interfere with adequate sleep quality 7. Avoid sugar, avoid sweets as there is a strong link between excessive sugar intake, diabetes, and cognitive impairment We discussed the Mediterranean diet, which has been shown to help patients reduce the risk of progressive memory disorders and reduces cardiovascular risk. This includes eating fish, eat fruits and green leafy vegetables, nuts like almonds and hazelnuts, walnuts, and also use olive oil. Avoid fast foods and fried foods as much as possible. Avoid sweets and sugar as sugar use has been linked to worsening of memory function.    FALL PRECAUTIONS: Be cautious when walking. Scan the area for obstacles that may increase the risk of trips and falls. When getting up in the mornings, sit up at the edge of the bed for a few minutes before getting out of bed. Consider elevating the bed at the head end to avoid drop of blood pressure when getting up. Walk always in a well-lit room (use night lights in the  walls). Avoid area rugs or power cords from appliances in the middle of the walkways. Use a walker or a cane if necessary and consider physical therapy for balance exercise. Get your eyesight checked regularly.  FINANCIAL OVERSIGHT: Supervision, especially oversight when making financial decisions or transactions is also recommended.  HOME SAFETY: Consider the safety of the kitchen when operating appliances like stoves, microwave oven, and blender. Consider having supervision and share cooking responsibilities until no longer able to participate in those. Accidents with firearms and other hazards in the house should be identified and addressed as well.   ABILITY TO BE LEFT ALONE: If patient is unable to contact 911 operator, consider using LifeLine, or when the need is there, arrange for someone to stay with patients. Smoking is a fire hazard, consider supervision or cessation. Risk of wandering should be assessed by caregiver and if detected at any point, supervision and safe proof recommendations should be instituted.  MEDICATION SUPERVISION: Inability to self-administer medication needs to be constantly addressed. Implement a mechanism to ensure safe administration of the medications.   DRIVING: Regarding driving, in patients with progressive memory problems, driving will be impaired. We advise to have someone else do the driving if trouble finding directions or if minor accidents are reported. Independent driving assessment is available to determine safety of driving.   If you are interested in the driving assessment, you can contact the following:  [employer] in [place]  [facility] [place] [phone] or [phone]    [place]  A Mediterranean diet refers to food and lifestyle choices that are based on the traditions of countries located on the [employer].  This way of eating has been shown to help prevent certain conditions and improve outcomes for people who have chronic diseases, like kidney disease and heart disease. What are tips for following this plan? Lifestyle  Cook and eat meals together with your family, when possible. Drink enough fluid to keep your urine clear or pale yellow. Be physically active every day. This includes: Aerobic exercise like running or swimming. Leisure activities like gardening, walking, or housework. Get 7-8 hours of sleep each night. If recommended by your health care provider, drink red wine in moderation. This means 1 glass a day for nonpregnant women and 2 glasses a day for men. A glass of wine equals 5 oz (150 mL). Reading food labels  Check the serving size of packaged foods. For foods such as rice and pasta, the serving size refers to the amount of cooked product, not dry. Check the total fat in packaged foods. Avoid foods that have saturated fat or trans fats. Check the ingredients list for added sugars, such as corn syrup. Shopping  At the grocery store, buy most of your food from the areas near the walls of the store. This includes: Fresh fruits and vegetables (produce). Grains, beans, nuts, and seeds. Some of these may be available in unpackaged forms or large amounts (in bulk). Fresh seafood. Poultry and eggs. Low-fat dairy products. Buy whole ingredients instead of prepackaged foods. Buy fresh fruits and vegetables in-season from local farmers markets. Buy frozen fruits and vegetables in resealable bags. If you do not have access to quality fresh seafood, buy precooked frozen shrimp or canned fish, such as tuna, salmon, or sardines. Buy small amounts of raw or cooked vegetables, salads, or olives from the deli or salad bar at your store. Stock your pantry so you always have certain foods on hand, such as olive oil, canned tuna, canned tomatoes, rice, pasta, and beans. Cooking  Cook foods with  extra-virgin olive oil instead of using butter or other vegetable oils. Have meat as a side dish, and have vegetables or grains as your main dish. This means having meat in small portions or adding small amounts of meat to foods like pasta or stew. Use beans or vegetables instead of meat in common dishes like chili or lasagna. Experiment with different cooking methods. Try roasting or broiling vegetables instead of steaming or sauteing them. Add frozen vegetables to soups, stews, pasta, or rice. Add nuts or seeds for added healthy fat at each meal. You can add these to yogurt, salads, or vegetable dishes. Marinate fish or vegetables using olive oil, lemon juice, garlic, and fresh herbs. Meal planning  Plan to eat 1 vegetarian meal one day each week. Try to work up to 2 vegetarian meals, if possible. Eat seafood 2 or more times a week. Have healthy snacks readily available, such as: Vegetable sticks with hummus. Greek yogurt. Fruit and nut trail mix. Eat balanced meals throughout the week. This includes: Fruit: 2-3 servings a day Vegetables: 4-5 servings a day Low-fat dairy: 2 servings a day Fish, poultry, or lean meat: 1 serving a day Beans and legumes: 2 or more servings a week Nuts and seeds: 1-2 servings a day Whole grains: 6-8 servings a day Extra-virgin olive oil: 3-4 servings a day Limit red meat and sweets to only a few servings a month What are my food choices? Mediterranean diet  Recommended Grains: Whole-grain pasta. Brown rice. Bulgar wheat. Polenta. Couscous. Whole-wheat bread. [place]. Vegetables: Artichokes. Beets. Broccoli. Cabbage. Carrots. Eggplant. Green beans. Chard. Kale. Spinach. Onions. Leeks. Peas. Squash. Tomatoes. Peppers. Radishes. Fruits: Apples. Apricots. Avocado. Berries. Bananas. Cherries. Dates. Figs. Grapes. Lemons. Melon. Oranges. Peaches. Plums. Pomegranate. Meats and other protein foods: Beans. Almonds. Sunflower seeds. Pine nuts. Peanuts. [place].  Salmon. Scallops. Shrimp. [place]. Tilapia. Clams. Oysters. Eggs. Dairy: Low-fat milk. Cheese. Greek yogurt. Beverages: Water. Red wine. Herbal tea. Fats and oils: Extra virgin olive oil. Avocado oil. Grape seed oil. Sweets and desserts: [place] yogurt with honey. Baked apples. Poached pears. Trail mix. Seasoning and other foods: Basil. Cilantro. Coriander. Cumin. Mint. Parsley. Sage. Rosemary. Tarragon. Garlic. Oregano. Thyme. Pepper. Balsalmic vinegar. Tahini. Hummus. Tomato sauce. Olives. Mushrooms. Limit these Grains: Prepackaged pasta or rice dishes. Prepackaged cereal with added sugar. Vegetables: Deep fried potatoes (french fries). Fruits: Fruit canned in syrup. Meats and other protein foods: Beef. Pork. Lamb. Poultry with skin. Hot dogs. Bernie[place] Salines. Dairy: Ice cream. Sour cream. Whole milk. Beverages: Juice. Sugar-sweetened soft drinks. Beer. Liquor and spirits. Fats and oils: Butter. Canola oil. Vegetable oil. Beef fat (tallow). Lard. Sweets and desserts: Cookies. Cakes. Pies. Candy. Seasoning and other foods: Mayonnaise. Premade sauces and marinades. The items listed may not be a complete list. Talk with your dietitian about what dietary choices are right for you. Summary The Mediterranean diet includes both food and lifestyle choices. Eat a variety of fresh fruits and vegetables, beans, nuts, seeds, and whole grains. Limit the amount of red meat and sweets that you eat. Talk with your health care provider about whether it is safe for you to drink red wine in moderation. This means 1 glass a day for nonpregnant women and 2 glasses a day for men. A glass of wine equals 5 oz (150 mL). This information is not intended to replace advice given to you by your health care provider. Make sure you discuss any questions you have with your health care provider. Document Released: 02/13/2016 Document Revised: 03/17/2016 Document Reviewed: 02/13/2016 Elsevier Interactive Patient Education  2017 [employer]

## 2021-04-17 DIAGNOSIS — R413 Other amnesia: Secondary | ICD-10-CM | POA: Insufficient documentation

## 2021-04-27 ENCOUNTER — Other Ambulatory Visit: Payer: 59

## 2021-04-29 ENCOUNTER — Other Ambulatory Visit: Payer: Self-pay

## 2021-04-29 ENCOUNTER — Ambulatory Visit
Admission: RE | Admit: 2021-04-29 | Discharge: 2021-04-29 | Disposition: A | Discharge status: Home / Self Care | Payer: 59 | Source: Ambulatory Visit | Attending: Physician Assistant | Admitting: Physician Assistant

## 2021-05-01 ENCOUNTER — Telehealth: Payer: Self-pay | Admitting: Physician Assistant

## 2021-05-01 NOTE — Telephone Encounter (Signed)
Patient called to see if her MRI results are ready yet from the test on 05/01/21.

## 2021-05-01 NOTE — Telephone Encounter (Signed)
Awaiting on Tina Townsend to release the results.

## 2021-05-02 NOTE — Telephone Encounter (Signed)
Please advise on MRI to patient when you return per patient. Thanks.

## 2021-05-06 NOTE — Telephone Encounter (Signed)
No answer at 11:27

## 2021-05-06 NOTE — Telephone Encounter (Signed)
Pt said if she misses the call, to please LM on  her VM. She works from  home and may not be able to pick up. Said could LM regarding results

## 2021-05-07 NOTE — Telephone Encounter (Signed)
Pt said she never recvd a call back for her results. She wants a call back today.

## 2021-05-08 ENCOUNTER — Other Ambulatory Visit: Payer: Self-pay

## 2021-05-08 ENCOUNTER — Other Ambulatory Visit: Payer: Self-pay | Admitting: Physician Assistant

## 2021-05-08 DIAGNOSIS — R413 Other amnesia: Secondary | ICD-10-CM

## 2021-05-08 DIAGNOSIS — D329 Benign neoplasm of meninges, unspecified: Secondary | ICD-10-CM

## 2021-05-08 NOTE — Telephone Encounter (Signed)
Mri ordered and copy to PCP and pscy.

## 2021-05-23 ENCOUNTER — Ambulatory Visit
Admission: RE | Admit: 2021-05-23 | Discharge: 2021-05-23 | Disposition: A | Discharge status: Home / Self Care | Payer: 59 | Source: Ambulatory Visit | Attending: Physician Assistant | Admitting: Physician Assistant

## 2021-05-23 ENCOUNTER — Other Ambulatory Visit: Payer: Self-pay

## 2021-05-23 DIAGNOSIS — D329 Benign neoplasm of meninges, unspecified: Secondary | ICD-10-CM

## 2021-05-23 DIAGNOSIS — R413 Other amnesia: Secondary | ICD-10-CM

## 2021-05-23 MED ORDER — GADOBENATE DIMEGLUMINE 529 MG/ML IV SOLN
17.0000 mL | Freq: Once | INTRAVENOUS | Status: AC | PRN
  Administered 2021-05-23: 17 mL via INTRAVENOUS

## 2021-05-26 ENCOUNTER — Telehealth: Payer: Self-pay | Admitting: Physician Assistant

## 2021-05-26 DIAGNOSIS — R9389 Abnormal findings on diagnostic imaging of other specified body structures: Secondary | ICD-10-CM

## 2021-05-26 NOTE — Telephone Encounter (Signed)
Pt called in needing MRI results

## 2021-05-27 ENCOUNTER — Telehealth: Payer: Self-pay | Admitting: Physician Assistant

## 2021-05-27 NOTE — Telephone Encounter (Signed)
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 It would not cause her symptoms. If she is very worried, can see Neurosurgery to weigh in, but I doubt they would do anything. The rest of the MRI is good without tumor, stroke or bleed! Thanks

## 2021-05-27 NOTE — Telephone Encounter (Signed)
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 It would not cause her symptoms. If she is very worried, can see Neurosurgery to weigh in The rest of the MRI is good without tumor, stroke or bleed. Pt stated that she would like to see neurosurgery because in 2014 she was not told about the growth and referral was placed in epic. She would like to see Dr Annette Stable

## 2021-05-27 NOTE — Telephone Encounter (Signed)
Pt said she is needing a call today before we close. She has severe anxiety and she needs to know her results.

## 2021-05-27 NOTE — Telephone Encounter (Signed)
Pt said she missed a call from someone regarding results

## 2021-05-27 NOTE — Telephone Encounter (Signed)
Per Clarise Cruz PA pt does not want the Neurosurgery referral

## 2021-08-01 NOTE — Telephone Encounter (Signed)
error 

## 2021-08-05 DIAGNOSIS — N1831 Chronic kidney disease, stage 3a: Secondary | ICD-10-CM | POA: Insufficient documentation

## 2021-08-05 HISTORY — DX: Chronic kidney disease, stage 3a: N18.31

## 2021-08-22 ENCOUNTER — Other Ambulatory Visit: Payer: Self-pay

## 2021-08-22 ENCOUNTER — Ambulatory Visit: Payer: 59

## 2021-08-22 ENCOUNTER — Ambulatory Visit (INDEPENDENT_AMBULATORY_CARE_PROVIDER_SITE_OTHER): Payer: 59 | Admitting: Psychology

## 2021-08-22 ENCOUNTER — Encounter: Payer: Self-pay | Admitting: Psychology

## 2021-08-22 DIAGNOSIS — M5416 Radiculopathy, lumbar region: Secondary | ICD-10-CM | POA: Insufficient documentation

## 2021-08-22 DIAGNOSIS — F329 Major depressive disorder, single episode, unspecified: Secondary | ICD-10-CM | POA: Insufficient documentation

## 2021-08-22 DIAGNOSIS — F411 Generalized anxiety disorder: Secondary | ICD-10-CM | POA: Diagnosis not present

## 2021-08-22 DIAGNOSIS — R4189 Other symptoms and signs involving cognitive functions and awareness: Secondary | ICD-10-CM

## 2021-08-22 DIAGNOSIS — Z8616 Personal history of COVID-19: Secondary | ICD-10-CM

## 2021-08-22 DIAGNOSIS — R41840 Attention and concentration deficit: Secondary | ICD-10-CM

## 2021-08-22 DIAGNOSIS — F332 Major depressive disorder, recurrent severe without psychotic features: Secondary | ICD-10-CM

## 2021-08-22 NOTE — Progress Notes (Signed)
NEUROPSYCHOLOGICAL EVALUATION [place] Department of Neurology  Date of Evaluation: August 22, 2021  Reason for Referral:   Tina Townsend is a 66 y.o. right-handed African-American female referred by  Sharene Butters, PA-C , to characterize her current cognitive functioning and assist with diagnostic clarity and treatment planning in the context of subjective cognitive decline, various psychiatric comorbidities, and history of COVID-19 infection.   Assessment and Plan:   Clinical Impression(s): Tina Townsend's pattern of performance is suggestive of performance variability across attention/concentration, executive functioning, and both encoding (i.e., learning) and delayed retrieval aspects of memory. While confrontation exhibited a normative weakness, she answered a majority of items on this task correctly to the extent where a true functional weakness is not believed to be present. Performances were otherwise appropriate across processing speed, receptive and expressive language, visuospatial abilities, and recognition/consolidation aspects of memory. Tina Townsend denied difficulties completing instrumental activities of daily living (ADLs) independently and continues to work a full-time job without reported difficulty.  Across acute mood-related questionnaires, Tina Townsend describe severe symptoms of both anxiety and depression occurring within the past 1-2 weeks. Additionally, across a more comprehensive personality assessment, she elevated numerous clinical subscales surrounding somatic complaints, anxiety, anxiety-related disorders, depression, paranoia, schizophrenia, features of borderline personality disorder, and non-support. Responses across this questionnaire suggest an individual with significant tension, unhappiness, and pessimism. Her reported degree of anxiety was severe, even when compared to purely clinical samples. She is likely plagued by  worry to the extent that her ability to concentrate and attend are significantly compromised. She likely feels a great deal of tension, has difficulty relaxing, and experiences day-to-day fatigue as a result of this tension. Her responses also align with individuals who have experienced a disturbing traumatic experience in the past which continues to affect her during the present (e.g., concerns for PTSD). She also described personality traits surrounding emotional lability. Outside of anxiety, she reported severe symptoms of depression and is likely plagued by thoughts of worthlessness, hopelessness, and personal failure. She openly admitted to feelings of sadness, loss of interest in normal activities, and a loss in pleasure. She additionally described an unusual degree of concern surrounding her physical functioning and is likely to report that her daily functioning has been compromised by numerous and varied physical problems. Finally, during periods of stressful times, her responses suggest that she may be prone to being self-critical and pessimistic. This is likely exacerbated by her perception that she does not have the availability of strong social support.  At the present time, the most likely culprit for subjective cognitive dysfunction, as well as variability across cognitive testing, is the quite severe extent of psychiatric distress as described above. Severe anxiety and depression will absolutely affect cognitive abilities, especially across attention/concentration, executive functioning, and learning and memory. Additionally, given neuroimaging that suggested progressive small vessel ischemic changes over the years, there could also be an ongoing vascular contribution to her current presentation. Vascular-related cognitive changes would also affect similar areas. I find the likelihood of Tina Townsend having ADHD to be low as she denied any noticeable difficulties or related psychosocial impairments  throughout childhood, as required by this condition. Rather, she was diagnosed as an adult in the midst of ongoing psychiatric distress. Symptoms attributed to ADHD certainly could be explained by anxiety and depression as there is significant symptom overlap. However, if ADHD were present, this would also impact cognitive abilities identified above and be worsened by the severity of ongoing psychiatric  distress. While her experience with COVID-19 may have acutely exacerbated difficulties, difficulties were reported to be present prior to her illness, eliminating this as a potential cause. Based upon current testing, I see no compelling evidence to warrant concerns for the presence of a neurodegenerative illness.   Recommendations: Despite Tina Townsend reporting that current medications are effective at managing psychiatric symptoms during the current interview, her responses across mood-related questionnaires suggest that this is not the case. I would recommend that she speak with her psychiatrist to adjust her current medication regimen to improve ongoing distress.  Research suggests that the best treatment for psychiatric distress is a combination of medication and therapeutic intervention. As such, I would strongly encourage Tina Townsend to engage in short-term psychotherapy to address ongoing anxiety and depression. She would benefit from an active and collaborative therapeutic environment, rather than one purely supportive in nature. Recommended treatment modalities include Cognitive Behavioral Therapy (CBT) or Acceptance and Commitment Therapy (ACT). She could seek a referral in the community, or perhaps through her employer via an EAP program.   Should psychiatric symptoms become adequately managed and she continue to report progressive cognitive decline, a repeat neuropsychological evaluation could be considered at that time.  Tina Townsend is encouraged to attend to lifestyle factors for brain  health (e.g., regular physical exercise, good nutrition habits, regular participation in cognitively-stimulating activities, and general stress management techniques), which are likely to have benefits for both emotional adjustment and cognition. In fact, in addition to promoting good general health, regular exercise incorporating aerobic activities (e.g., brisk walking, jogging, cycling, etc.) has been demonstrated to be a very effective treatment for depression and stress, with similar efficacy rates to both antidepressant medication and psychotherapy. Optimal control of vascular risk factors (including safe cardiovascular exercise and adherence to dietary recommendations) is encouraged. Continued participation in activities which provide mental stimulation and social interaction is also recommended.   When learning new information, she would benefit from information being broken up into small, manageable pieces. She may also find it helpful to articulate the material in her own words and in a context to promote encoding at the onset of a new task. This material may need to be repeated multiple times to promote encoding.  Memory can be improved using internal strategies such as rehearsal, repetition, chunking, mnemonics, association, and imagery. External strategies such as written notes in a consistently used memory journal, visual and nonverbal auditory cues such as a calendar on the refrigerator or appointments with alarm, such as on a cell phone, can also help maximize recall.    To address problems with fluctuating attention and executive dysfunction, she may wish to consider:   -Avoiding external distractions when needing to concentrate   -Limiting exposure to fast paced environments with multiple sensory demands   -Writing down complicated information and using checklists   -Attempting and completing one task at a time (i.e., no multi-tasking)   -Verbalizing aloud each step of a task to maintain  focus   -Reducing the amount of information considered at one time  Review of Records:   Tina Townsend was seen by [facility] Neurology Sharene Butters, PA-C) on 04/16/2021 for an evaluation of memory loss. At that time, memory issues were said to be present for the past two months after contracting COVID-19 in August 2022. Difficulties included trouble recalling information on demand and word finding deficits. She acknowledged a high degree of work-related stress, as well as stress surrounding her caring for her adult son who is  dependant on her, as well as the passing of her husband during the previous year. She also has a longstanding history of depression and anxiety and is followed by her psychiatrist for medication intervention. She denied a history of paranoia, hallucinations, or REM behaviors. There were reported chronic pain symptoms stemming from her falling two times down the stairs, leading to some back pain. She also resided in and utilized the waters of [place] during the periods of now known water contamination. ADLs were described as intact. She denied headaches, double vision, dizziness, focal numbness or tingling, unilateral weakness, tremors, urine incontinence or retention, or past substance abuse. Performance on a brief cognitive screening instrument (MOCA) was 22/30. Ultimately, Tina Townsend was referred for a comprehensive neuropsychological evaluation to characterize her cognitive abilities and to assist with diagnostic clarity and treatment planning.   Brain MRI on 04/23/2013 was unremarkable. Brain MRI on 04/29/2021 revealed mild microvascular ischemic changes, progressed since 2014, as well as a 1.5 x 1.7 cm lobular extra-axial low signal focus overlying the lateral aspect of the right cerebellar hemisphere, suspicious for a heavily calcified meningioma. Cerebral volume was age-appropriate.   Past Medical History:  Diagnosis Date   Acquired trigger finger 12/30/2017   ADHD  (attention deficit hyperactivity disorder) 10/31/2020   Carpal tunnel syndrome 05/10/2013   Cervical spondylosis without myelopathy 05/10/2013   Chronic kidney disease, stage 3a 08/05/2021   Chronic sinusitis 10/31/2020   Degenerative lumbar spinal stenosis 10/31/2020   Essential hypertension 06/26/2020   Generalized anxiety disorder 10/10/2020   GERD (gastroesophageal reflux disease) 10/31/2020   Greater trochanteric pain syndrome 10/31/2020   History of COVID-19 02/2021   HSV (herpes simplex virus) anogenital infection    Hypercalcemia 06/26/2020   Hyperkalemia 06/26/2020   Hyperlipidemia    Hypothyroidism 10/31/2020   Impingement syndrome of right shoulder 07/20/2019   Insomnia    Lateral epicondylitis of right elbow 07/20/2019   Lumbar radiculopathy    Major depressive disorder    Mitral valve prolapse 02/21/2016   Personal history of colonic polyps 10/31/2020   Prolapsed lumbar disc 12/30/2017   Solitary pulmonary nodule 10/31/2020   Spinal stenosis of cervical region 06/08/2013   Trochanteric bursitis of left hip 09/24/2020   Trochanteric bursitis of right hip 09/24/2020   Type 2 diabetes mellitus without complication 28/41/3244   Vitamin D deficiency 06/26/2020    History reviewed. No pertinent surgical history.   Current Outpatient Medications:    ALPRAZolam (XANAX) 0.5 MG tablet, Take 0.5 mg by mouth at bedtime as needed for anxiety. (Patient not taking: Reported on 04/16/2021), Disp: , Rfl:    aspirin EC 81 MG tablet, Take 81 mg by mouth daily., Disp: , Rfl:    atorvastatin (LIPITOR) 40 MG tablet, Take 40 mg by mouth daily., Disp: , Rfl:    buPROPion (WELLBUTRIN XL) 150 MG 24 hr tablet, Take 450 mg by mouth every morning., Disp: , Rfl:    buPROPion (WELLBUTRIN XL) 300 MG 24 hr tablet, Take 300 mg by mouth every morning., Disp: , Rfl:    BuPROPion HCl (WELLBUTRIN XL PO), Take 450 mg by mouth every morning., Disp: , Rfl:    chlordiazePOXIDE (LIBRIUM) 5 MG capsule, Take  10 mg by mouth at bedtime., Disp: , Rfl:    cholecalciferol (VITAMIN D) 1000 units tablet, Take 1,000 Units by mouth daily., Disp: , Rfl:    furosemide (LASIX) 20 MG tablet, Take 20 mg by mouth daily., Disp: , Rfl:    levothyroxine (SYNTHROID) 50 MCG  tablet, Take 50 mcg by mouth daily., Disp: , Rfl:    lisinopril-hydrochlorothiazide (PRINZIDE,ZESTORETIC) 20-12.5 MG per tablet, Take 1 tablet by mouth daily., Disp: , Rfl:    metFORMIN (GLUMETZA) 500 MG (MOD) 24 hr tablet, Take 500 mg by mouth daily with breakfast., Disp: , Rfl:    Polyethylene Glycol 3350 POWD, Take by mouth as needed., Disp: , Rfl:    senna (SENOKOT) 8.6 MG tablet, Take 3 tablets by mouth daily., Disp: , Rfl:    sertraline (ZOLOFT) 100 MG tablet, Take by mouth., Disp: , Rfl:    Urea 39 % CREA, Apply topically 2 (two) times daily., Disp: , Rfl:    valACYclovir (VALTREX) 1000 MG tablet, Take 1,000 mg by mouth 2 (two) times daily., Disp: , Rfl:   Clinical Interview:   The following information was obtained during a clinical interview with Tina Townsend prior to cognitive testing.  Cognitive Symptoms: Decreased short-term memory: Endorsed. Examples included repeating Townsend, asking repetitive questions, and misplacing things around her residence to the extent she is often unable to find them. She also noted it being harder to process and grasp changes to her work routine. Memory difficulties were said to be present prior to her contracting COVID-19 this past summer and have likely been present for several years. She noted that difficulties had seemed to gradually worsen over time.  Decreased long-term memory: Denied. Decreased attention/concentration: Endorsed. She reported longstanding difficulties with sustained attention, distractibility, and disorganization. She did not confirm that these symptoms were present to a significant extent throughout childhood. She reported being diagnosed with ADHD as an adult approximately 10-15 years  ago, likely by her psychiatrist.  Reduced processing speed: Endorsed. She reported experiencing "terrible brain fog" and stated that she does not seem "as sharp as I used to be."  Difficulties with executive functions: Endorsed. She acknowledged trouble with organization and multi-tasking which are longstanding but have been exacerbated lately. She denied trouble with impulsivity or any significant personality changes.  Difficulties with emotion regulation: Denied. Difficulties with receptive language: Denied. Difficulties with word finding: Endorsed. Decreased visuoperceptual ability: Denied.  Difficulties completing ADLs: Denied.  Additional Medical History: History of traumatic brain injury/concussion: Denied. History of stroke: Denied. History of seizure activity: Denied. History of known exposure to toxins: Endorsed. She reported residing in [place] during the time frame of newly discovered water contamination. Said contamination has been linked to the development of various cancers and other medical ailments. While she did not know of any direct correlation between this exposure and her current symptoms, she did acknowledge drinking, cooking with, bathing, and swimming in contaminated water in the past.   Symptoms of chronic pain: Endorsed. She reported chronic back pain attributed to a "severely bulging disc."  Experience of frequent headaches/migraines: Denied. Headache symptoms were said to occur "sometimes" and "not on a regular basis." Frequent instances of dizziness/vertigo: Endorsed. She reported symptoms when standing quickly or abruptly changing her position. Symptoms are generally brief in nature. However, there are times where she will sit back down and feel Townsend leaning towards one direction.   Sensory changes: She wears glasses with benefit. Other sensory changes/difficulties (e.g., hearing, taste, smell) were denied.  Balance/coordination difficulties: Endorsed. She  reported a mild sensation of ongoing instability, with perhaps the right side being somewhat worse than the left. The cause for this was unknown. She denied any recent falls.  Other motor difficulties: Denied. She did report that the quality of her handwriting has greatly diminished lately.  Sleep History: Estimated hours obtained each night: 7 hours.  Difficulties falling asleep: Endorsed. She reported insomnia and utilizes various medications to assist with her falling asleep. She at one point alluded to some insomnia symptoms being related to ongoing anxiety.  Difficulties staying asleep: Denied. Feels rested and refreshed upon awakening: Endorsed "pretty much."  History of snoring: Denied. History of waking up gasping for air: Denied. Witnessed breath cessation while asleep: Denied.  History of vivid dreaming: Denied. Excessive movement while asleep: Denied. Instances of acting out her dreams: Denied.  Psychiatric/Behavioral Health History: Depression: Endorsed. She reported a longstanding history of depressive symptoms, dating back many years. She acknowledged current symptoms of depression, as well as being more irritable and short-tempered with others. She is followed by her psychiatrist for medication intervention. She did not report past individual psychotherapy. Current or remote suicidal ideation, intent, or plan was denied.  Anxiety: Endorsed. She also reported longstanding symptoms of largely generalized anxiety, present for many years. She reported being in the process of limiting use of Xanax. Current medications were said to be helpful at managing psychiatric symptoms.  Mania: Denied. Trauma History: Denied. Visual/auditory hallucinations: Denied. Delusional thoughts: Denied.  Tobacco: Denied. Alcohol: She denied current alcohol consumption as well as a history of problematic alcohol abuse or dependence.  Recreational drugs: Denied.  Family History: Problem Relation Age  of Onset   Heart disease Mother    COPD Mother    Diabetes Mother    Cancer Mother    Hypertension Sister    Cancer Sister    Dementia Maternal Grandmother        symptom onset likely in early to mid 32s   This information was confirmed by [name].  Academic/Vocational History: Highest level of educational attainment: 16 years. She graduated from high school and earned a Dietitian in education. She described Townsend as a good (A/B) student in academic settings. No relative weaknesses were reported.   History of developmental delay: Denied. History of grade repetition: Denied. Enrollment in special education courses: Denied. History of LD: Denied.  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 Prior medical records suggest notable stress associated with this position. Outside of picking up on new tasks or protocols as quickly as she feels she should, she denied any current performance issues.   Evaluation Results:   Behavioral Observations: Tina Townsend was unaccompanied, arrived to her appointment on time, and was appropriately dressed and groomed. she appeared alert and oriented. Observed gait and station were within normal limits. Gross motor functioning appeared intact upon informal observation and no abnormal movements (e.g., tremors) were noted. her affect was generally relaxed and positive, but did range appropriately given the subject being discussed during the clinical interview or the task at hand during testing procedures. Spontaneous speech was fluent and word finding difficulties were not observed during the clinical interview. Thought processes were coherent, organized, and normal in content. Insight into her cognitive difficulties appeared adequate. During testing, she made comments surrounding her being "bored." She did require some encouragement to persist, especially across memory tasks. Sustained attention was appropriate.  Overall, Tina Townsend was cooperative with the clinical interview and subsequent testing procedures.   Adequacy of Effort: The validity of neuropsychological testing is limited by the extent to which the individual being tested may be assumed to have exerted adequate effort during testing. Scores across stand-alone and embedded performance validity measures were within expectation. As such, the results of the current evaluation are  believed to be a valid representation of Tina Townsend's current cognitive functioning. Across symptom validity measures, concern was raised for symptom exaggeration and Tina Townsend in an overly negative light. However, this did not invalidate the instrument and is more like a "cry for help" rather than an active attempt to manipulate this instrument.   Test Results: Ms. Mossbarger was fully oriented at the time of the current evaluation.  Intellectual abilities based upon educational and vocational attainment were estimated to be in the average range. Premorbid abilities were estimated to be within the below average range based upon a single-word reading test.   Processing speed was average to above average. Basic attention was below average. More complex attention (e.g., working memory) was well below average. Executive functioning was mildly variable but largely average to above average. She did score in the well below average range on a task assessing visuomotor cognitive flexibility, as well as another assessing nonverbal abstract reasoning.  While not directly assessed, receptive language abilities were believed to be intact. Likewise, Ms. Godina did not exhibit any difficulties comprehending task instructions and answered all questions asked of her appropriately. Assessed expressive language was variable normatively. She performed in the average to above average range across phonemic and semantic fluency. She performed in the well below average on a  confrontation naming task. However, she only missed three items on this task overall, suggesting that this more likely represents a normative weakness as opposed to ongoing functional impairment.      Assessed visuospatial/visuoconstructional abilities were largely average to above average. Points were lost on her drawing of a clock due to mild spatial abnormalities in numerical placement, as well as poor hand representation.    Learning (i.e., encoding) of novel verbal and visual information was variable, ranging from the well below average to average normative ranges. Spontaneous delayed recall (i.e., retrieval) of previously learned information was also variable, ranging from the exceptionally low to average normative ranges. Retention rates were 96% across a story learning task, 0% across a list learning task, and 100% across a shape learning task. Performance across recognition tasks was below average to average, suggesting evidence for information consolidation.   Results of emotional screening instruments suggested that recent symptoms of generalized anxiety were in the severe range, while symptoms of depression were also within the severe range. Across a more comprehensive personality assessment, she elevated numerous clinical subscales surrounding somatic complaints, anxiety, anxiety-related disorders, depression, paranoia, schizophrenia, features of borderline personality disorder, and non-support. A screening instrument assessing recent sleep quality suggested the presence of mild sleep dysfunction.  Tables of Scores:   Note: This summary of test scores accompanies the interpretive report and should not be considered in isolation without reference to the appropriate sections in the text. Descriptors are based on appropriate normative data and may be adjusted based on clinical judgment. Terms such as "Within Normal Limits" and "Outside Normal Limits" are used when a more specific description of  the test score cannot be determined.       Percentile - Normative Descriptor > 98 - Exceptionally High 91-97 - Well Above Average 75-90 - Above Average 25-74 - Average 9-24 - Below Average 2-8 - Well Below Average < 2 - Exceptionally Low       Validity:   DESCRIPTOR       ACS Word Choice: --- --- Within Normal Limits  Dot Counting Test: --- --- Within Normal Limits  NAB EVI: --- --- Within Normal Limits  Orientation:      Raw Score Percentile   NAB Orientation, Form 1 29/29 --- ---       Cognitive Screening:      Raw Score Percentile   SLUMS: 19/30 --- ---       Intellectual Functioning:      Standard Score Percentile   Test of Premorbid Functioning: 86 18 Below Average       Memory:     NAB Memory Module, Form 1: T Score Percentile   List Learning       Total Trials 1-3 19/36 (36) 8 Well Below Average    List B 4/12 (43) 25 Average    Short Delay Free Recall 2/12 (19) <1 Exceptionally Low    Long Delay Free Recall 0/12 (19) <1 Exceptionally Low    Retention Percentage 0 (11) <1 Exceptionally Low    Recognition Discriminability 6 (44) 27 Average  Shape Learning       Total Trials 1-3 10/27 (28) 2 Well Below Average    Delayed Recall 4/9 (34) 5 Well Below Average    Retention Percentage 100 (51) 54 Average    Recognition Discriminability 5 (42) 21 Below Average  Story Learning       Immediate Recall 45/80 (29) 2 Well Below Average    Delayed Recall 25/40 (34) 5 Well Below Average    Retention Percentage 96 (52) 58 Average  Daily Living Memory       Immediate Recall 42/51 (44) 27 Average    Delayed Recall 14/17 (43) 25 Average    Retention Percentage 82 (46) 34 Average    Recognition Hits 9/10 (51) 54 Average       Attention/Executive Function:     Trail Making Test (TMT): Raw Score (T Score) Percentile     Part A 36 secs.,  0 errors (48) 42 Average    Part B 165 secs.,  2 errors (35) 7 Well Below Average         Scaled Score Percentile   WAIS-IV  Coding: 12 75 Above Average       NAB Attention Module, Form 1: T Score Percentile     Digits Forward 40 16 Below Average    Digits Backwards 32 4 Well Below Average       D-KEFS Color-Word Interference Test: Raw Score (Scaled Score) Percentile     Color Naming 27 secs. (12) 75 Above Average    Word Reading 20 secs. (12) 75 Above Average    Inhibition 78 secs. (8) 25 Average      Total Errors 0 errors (12) 75 Above Average    Inhibition/Switching 64 secs. (12) 75 Above Average      Total Errors 8 errors (5) 5 Well Below Average       D-KEFS Verbal Fluency Test: Raw Score (Scaled Score) Percentile     Letter Total Correct 36 (10) 50 Average    Category Total Correct 40 (12) 75 Above Average    Category Switching Total Correct 16 (14) 91 Well Above Average    Category Switching Accuracy 14 (13) 84 Above Average      Total Set Loss Errors 1 (11) 63 Average      Total Repetition Errors 3 (10) 50 Average       Language:     Verbal Fluency Test: Raw Score (T Score) Percentile     Phonemic Fluency (FAS) 36 (47) 38 Average    Animal Fluency 17 (51) 54 Average  NAB Language Module, Form 1: T Score Percentile     Naming 28/31 (33) 5 Well Below Average       Visuospatial/Visuoconstruction:      Raw Score Percentile   Clock Drawing: 7/10 --- Within Normal Limits       NAB Spatial Module, Form 1: T Score Percentile     Figure Drawing Copy 59 82 Above Average        Scaled Score Percentile   WAIS-IV Block Design: 8 25 Average  WAIS-IV Matrix Reasoning: 5 5 Well Below Average       Mood and Personality:      Raw Score Percentile   Beck Depression Inventory - II: 38 --- Severe  PROMIS Anxiety Questionnaire: 31 --- Severe       Personality Assessment Inventory: T Score  Percentile     Inconsistency 58 --- Within Normal Limits    Infrequency 44 --- Within Normal Limits    Negative Impression 77 --- Moderate    Positive Impression 22 --- Within Normal Limits    Somatic  Complaints 75 --- Elevated    Anxiety 92 --- Elevated    Anxiety-Related Disorders 98 --- Elevated    Depression 92 --- Elevated    Mania 66 --- Within Normal Limits    Paranoia 70 --- Elevated    Schizophrenia 76 --- Elevated    Borderline Features 71 --- Elevated    Antisocial Features 54 --- Within Normal Limits    Alcohol Problems 47 --- Within Normal Limits    Drug Problems 60 --- Within Normal Limits    Aggression 63 --- Within Normal Limits    Suicidal Ideation 43 --- Within Normal Limits    Stress 59 --- Within Normal Limits    Non Support 75 --- Elevated    Treatment Rejection 23 --- Within Normal Limits    Dominance 53 --- Within Normal Limits    Warmth 37 --- Within Normal Limits       Additional Questionnaires:      Raw Score Percentile   PROMIS Sleep Disturbance Questionnaire: 29 --- Mild   Informed Consent and Coding/Compliance:   The current evaluation represents a clinical evaluation for the purposes previously outlined by the referral source and is in no way reflective of a forensic evaluation.   Ms. Scheck was provided with a verbal description of the nature and purpose of the present neuropsychological evaluation. Also reviewed were the foreseeable risks and/or discomforts and benefits of the procedure, limits of confidentiality, and mandatory reporting requirements of this provider. The patient was given the opportunity to ask questions and receive answers about the evaluation. Oral consent to participate was provided by the patient.   This evaluation was conducted by Christia Reading, Ph.D., ABPP-CN, board certified clinical neuropsychologist. Ms. Phillipson completed a clinical interview with Dr. Melvyn Novas, billed as one unit [phone], and 150 minutes of cognitive testing and scoring, billed as one unit [phone] and four additional units 96139. Psychometrist Cruzita Lederer, B.S., assisted Dr. Melvyn Novas with test administration and scoring procedures. As a separate and discrete  service, Dr. Melvyn Novas spent a total of 160 minutes in interpretation and report writing billed as one unit [phone] and two units 96133.

## 2021-08-22 NOTE — Progress Notes (Signed)
° °  Psychometrician Note   Cognitive testing was administered to [place] by Cruzita Lederer, B.S. (psychometrist) under the supervision of Dr. Christia Reading, Ph.D., licensed psychologist on 08/22/2021. Ms. Huckeba did not appear overtly distressed by the testing session per behavioral observation or responses across self-report questionnaires. Rest breaks were offered.    The battery of tests administered was selected by Dr. Christia Reading, Ph.D. with consideration to Ms. Coon's current level of functioning, the nature of her symptoms, emotional and behavioral responses during interview, level of literacy, observed level of motivation/effort, and the nature of the referral question. This battery was communicated to the psychometrist. Communication between Dr. Christia Reading, Ph.D. and the psychometrist was ongoing throughout the evaluation and Dr. Christia Reading, Ph.D. was immediately accessible at all times. Dr. Christia Reading, Ph.D. provided supervision to the psychometrist on the date of this service to the extent necessary to assure the quality of all services provided.    Gabriel Rainwater will return within approximately 1-2 weeks for an interactive feedback session with Dr. Melvyn Novas at which time her test performances, clinical impressions, and treatment recommendations will be reviewed in detail. Ms. Totten understands she can contact our office should she require our assistance before this time.  A total of 150 minutes of billable time were spent face-to-face with Ms. Lomeli by the psychometrist. This includes both test administration and scoring time. Billing for these services is reflected in the clinical report generated by Dr. Christia Reading, Ph.D.  This note reflects time spent with the psychometrician and does not include test scores or any clinical interpretations made by Dr. Melvyn Novas. The full report will follow in a separate note.

## 2021-08-29 ENCOUNTER — Ambulatory Visit (INDEPENDENT_AMBULATORY_CARE_PROVIDER_SITE_OTHER): Payer: 59 | Admitting: Psychology

## 2021-08-29 ENCOUNTER — Other Ambulatory Visit: Payer: Self-pay

## 2021-08-29 DIAGNOSIS — F411 Generalized anxiety disorder: Secondary | ICD-10-CM | POA: Diagnosis not present

## 2021-08-29 DIAGNOSIS — R41840 Attention and concentration deficit: Secondary | ICD-10-CM

## 2021-08-29 DIAGNOSIS — F332 Major depressive disorder, recurrent severe without psychotic features: Secondary | ICD-10-CM

## 2021-08-29 NOTE — Progress Notes (Signed)
° °  Neuropsychology Feedback Session Tina Townsend. [facility]  Reason for Referral:   Tina Townsend is a 66 y.o. right-handed African-American female referred by  Tina Butters, PA-C , to characterize her current cognitive functioning and assist with diagnostic clarity and treatment planning in the context of subjective cognitive decline, various psychiatric comorbidities, and history of COVID-19 infection.  Feedback:   Tina Townsend completed a comprehensive neuropsychological evaluation on 08/22/2021. Please refer to that encounter for the full report and recommendations. Briefly, results suggested performance variability across attention/concentration, executive functioning, and both encoding (i.e., learning) and delayed retrieval aspects of memory. While confrontation exhibited a normative weakness, she answered a majority of items on this task correctly to the extent where a true functional weakness is not believed to be present. Across acute mood-related questionnaires, Tina Townsend describe severe symptoms of both anxiety and depression occurring within the past 1-2 weeks. Additionally, across a more comprehensive personality assessment, she elevated numerous clinical subscales surrounding somatic complaints, anxiety, anxiety-related disorders, depression, paranoia, schizophrenia, features of borderline personality disorder, and non-support. At the present time, the most likely culprit for subjective cognitive dysfunction, as well as variability across cognitive testing, is the quite severe extent of psychiatric distress. If ADHD were also present, this would also impact cognitive abilities identified above and be worsened by the severity of ongoing psychiatric distress. Based upon current testing, I see no compelling evidence to warrant concerns for the presence of a neurodegenerative illness.   Tina Townsend was unaccompanied during the current feedback  session. Content of the current session focused on the results of her neuropsychological evaluation. Tina Townsend was given the opportunity to ask questions and her questions were answered. She was encouraged to reach out should additional questions arise. A copy of her report was provided at the conclusion of the visit.      30 minutes were spent conducting the current feedback session with Tina Townsend, billed as one unit [phone].

## 2021-09-19 ENCOUNTER — Encounter: Payer: 59 | Admitting: Psychology

## 2021-10-17 ENCOUNTER — Ambulatory Visit (INDEPENDENT_AMBULATORY_CARE_PROVIDER_SITE_OTHER): Payer: 59 | Admitting: Physician Assistant

## 2021-10-17 ENCOUNTER — Encounter: Payer: Self-pay | Admitting: Physician Assistant

## 2021-10-17 VITALS — BP 112/75 | HR 83 | Resp 18 | Ht 63.0 in | Wt 182.0 lb

## 2021-10-17 DIAGNOSIS — F411 Generalized anxiety disorder: Secondary | ICD-10-CM

## 2021-10-17 DIAGNOSIS — R41840 Attention and concentration deficit: Secondary | ICD-10-CM

## 2021-10-17 DIAGNOSIS — F332 Major depressive disorder, recurrent severe without psychotic features: Secondary | ICD-10-CM | POA: Diagnosis not present

## 2021-10-17 NOTE — Patient Instructions (Signed)
It was a pleasure to see you today at our office.  ? ?Recommendations: ?Your brain looks Beautiful! Wish you only the best! ? ?RECOMMENDATIONS FOR ALL PATIENTS WITH MEMORY PROBLEMS: ?1. Continue to exercise (Recommend 30 minutes of walking everyday, or 3 hours every week) ?2. Increase social interactions - continue going to [place] and enjoy social gatherings with friends and family ?3. Eat healthy, avoid fried foods and eat more fruits and vegetables ?4. Maintain adequate blood pressure, blood sugar, and blood cholesterol level. Reducing the risk of stroke and cardiovascular disease also helps promoting better memory. ?5. Avoid stressful situations. Live a simple life and avoid aggravations. Organize your time and prepare for the next day in anticipation. ?6. Sleep well, avoid any interruptions of sleep and avoid any distractions in the bedroom that may interfere with adequate sleep quality ?7. Avoid sugar, avoid sweets as there is a strong link between excessive sugar intake, diabetes, and cognitive impairment ?We discussed the Mediterranean diet, which has been shown to help patients reduce the risk of progressive memory disorders and reduces cardiovascular risk. This includes eating fish, eat fruits and green leafy vegetables, nuts like almonds and hazelnuts, walnuts, and also use olive oil. Avoid fast foods and fried foods as much as possible. Avoid sweets and sugar as sugar use has been linked to worsening of memory function. ? ? ? ?FALL PRECAUTIONS: Be cautious when walking. Scan the area for obstacles that may increase the risk of trips and falls. When getting up in the mornings, sit up at the edge of the bed for a few minutes before getting out of bed. Consider elevating the bed at the head end to avoid drop of blood pressure when getting up. Walk always in a well-lit room (use night lights in the walls). Avoid area rugs or power cords from appliances in the middle of the walkways. Use a walker or a cane if  necessary and consider physical therapy for balance exercise. Get your eyesight checked regularly. ? ?FINANCIAL OVERSIGHT: Supervision, especially oversight when making financial decisions or transactions is also recommended. ? ?HOME SAFETY: Consider the safety of the kitchen when operating appliances like stoves, microwave oven, and blender. Consider having supervision and share cooking responsibilities until no longer able to participate in those. Accidents with firearms and other hazards in the house should be identified and addressed as well. ? ? ?ABILITY TO BE LEFT ALONE: If patient is unable to contact 911 operator, consider using LifeLine, or when the need is there, arrange for someone to stay with patients. Smoking is a fire hazard, consider supervision or cessation. Risk of wandering should be assessed by caregiver and if detected at any point, supervision and safe proof recommendations should be instituted. ? ?MEDICATION SUPERVISION: Inability to self-administer medication needs to be constantly addressed. Implement a mechanism to ensure safe administration of the medications. ? ? ?DRIVING: Regarding driving, in patients with progressive memory problems, driving will be impaired. We advise to have someone else do the driving if trouble finding directions or if minor accidents are reported. Independent driving assessment is available to determine safety of driving. ? ? ?If you are interested in the driving assessment, you can contact the following: ? ?[facility] [employer] [facility] in [place] ? ?[place] [phone] ? ?[facility] [phone] ? ?[facility] [phone] or [phone] ? ? ? ?Mediterranean Diet ?A Mediterranean diet refers to food and lifestyle choices that are based on the traditions of countries located on the [employer]. This  way of eating has been shown to help prevent certain conditions and improve outcomes for people who have  chronic diseases, like kidney disease and heart disease. ?What are tips for following this plan? ?Lifestyle  ?Cook and eat meals together with your family, when possible. ?Drink enough fluid to keep your urine clear or pale yellow. ?Be physically active every day. This includes: ?Aerobic exercise like running or swimming. ?Leisure activities like gardening, walking, or housework. ?Get 7-8 hours of sleep each night. ?If recommended by your health care provider, drink red wine in moderation. This means 1 glass a day for nonpregnant women and 2 glasses a day for men. A glass of wine equals 5 oz (150 mL). ?Reading food labels  ?Check the serving size of packaged foods. For foods such as rice and pasta, the serving size refers to the amount of cooked product, not dry. ?Check the total fat in packaged foods. Avoid foods that have saturated fat or trans fats. ?Check the ingredients list for added sugars, such as corn syrup. ?Shopping  ?At the grocery store, buy most of your food from the areas near the walls of the store. This includes: ?Fresh fruits and vegetables (produce). ?Grains, beans, nuts, and seeds. Some of these may be available in unpackaged forms or large amounts (in bulk). ?Fresh seafood. ?Poultry and eggs. ?Low-fat dairy products. ?Buy whole ingredients instead of prepackaged foods. ?Buy fresh fruits and vegetables in-season from local farmers markets. ?Buy frozen fruits and vegetables in resealable bags. ?If you do not have access to quality fresh seafood, buy precooked frozen shrimp or canned fish, such as tuna, salmon, or sardines. ?Buy small amounts of raw or cooked vegetables, salads, or olives from the deli or salad bar at your store. ?Stock your pantry so you always have certain foods on hand, such as olive oil, canned tuna, canned tomatoes, rice, pasta, and beans. ?Cooking  ?Cook foods with extra-virgin olive oil instead of using butter or other vegetable oils. ?Have meat as a side dish, and have  vegetables or grains as your main dish. This means having meat in small portions or adding small amounts of meat to foods like pasta or stew. ?Use beans or vegetables instead of meat in common dishes like chili or lasagna. ?Experiment with different cooking methods. Try roasting or broiling vegetables instead of steaming or saut?eing them. ?Add frozen vegetables to soups, stews, pasta, or rice. ?Add nuts or seeds for added healthy fat at each meal. You can add these to yogurt, salads, or vegetable dishes. ?Marinate fish or vegetables using olive oil, lemon juice, garlic, and fresh herbs. ?Meal planning  ?Plan to eat 1 vegetarian meal one day each week. Try to work up to 2 vegetarian meals, if possible. ?Eat seafood 2 or more times a week. ?Have healthy snacks readily available, such as: ?Vegetable sticks with hummus. ?[place] yogurt. ?Fruit and nut trail mix. ?Eat balanced meals throughout the week. This includes: ?Fruit: 2-3 servings a day ?Vegetables: 4-5 servings a day ?Low-fat dairy: 2 servings a day ?Fish, poultry, or lean meat: 1 serving a day ?Beans and legumes: 2 or more servings a week ?Nuts and seeds: 1-2 servings a day ?Whole grains: 6-8 servings a day ?Extra-virgin olive oil: 3-4 servings a day ?Limit red meat and sweets to only a few servings a month ?What are my food choices? ?Mediterranean diet ?Recommended ?Grains: Whole-grain pasta. Brown rice. Bulgar wheat. Polenta. Couscous. Whole-wheat bread. [place]. ?Vegetables: Artichokes. Beets. Broccoli. Cabbage. Carrots. Eggplant. Green beans.  [place]. Spinach. Onions. Leeks. Peas. Squash. Tomatoes. Peppers. Radishes. ?Fruits: Apples. Apricots. Avocado. Berries. Bananas. Cherries. Dates. Figs. Grapes. Lemons. Melon. Oranges. Peaches. Plums. Pomegranate. ?Meats and other protein foods: Beans. Almonds. Sunflower seeds. Pine nuts. Peanuts. [place]. Salmon. Scallops. Shrimp. Cross. Tilapia. Clams. Oysters. Eggs. ?Dairy: Low-fat milk. Cheese. Greek  yogurt. ?Beverages: Water. Red wine. Herbal tea. ?Fats and oils: Extra virgin olive oil. Avocado oil. Grape seed oil. ?Sweets and desserts: [place] yogurt with honey. Baked apples. Poached pears. Trail mix. ?Seasoning
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
## 2021-11-11 ENCOUNTER — Encounter: Payer: 59 | Admitting: Psychology

## 2021-11-18 ENCOUNTER — Encounter: Payer: 59 | Admitting: Psychology

## 2022-04-27 ENCOUNTER — Other Ambulatory Visit: Payer: Self-pay | Admitting: Obstetrics and Gynecology

## 2022-04-27 DIAGNOSIS — R928 Other abnormal and inconclusive findings on diagnostic imaging of breast: Secondary | ICD-10-CM

## 2022-05-02 ENCOUNTER — Ambulatory Visit
Admission: RE | Admit: 2022-05-02 | Discharge: 2022-05-02 | Disposition: A | Discharge status: Home / Self Care | Payer: Medicare Other | Source: Ambulatory Visit | Attending: Obstetrics and Gynecology | Admitting: Obstetrics and Gynecology

## 2022-05-02 ENCOUNTER — Ambulatory Visit: Admission: RE | Admit: 2022-05-02 | Payer: 59 | Source: Ambulatory Visit

## 2022-05-02 DIAGNOSIS — R928 Other abnormal and inconclusive findings on diagnostic imaging of breast: Secondary | ICD-10-CM

## 2022-05-08 ENCOUNTER — Other Ambulatory Visit: Payer: Self-pay

## 2022-05-08 DIAGNOSIS — D329 Benign neoplasm of meninges, unspecified: Secondary | ICD-10-CM

## 2022-05-14 ENCOUNTER — Institutional Professional Consult (permissible substitution): Payer: Medicare Other | Admitting: Pulmonary Disease

## 2022-06-15 ENCOUNTER — Ambulatory Visit (INDEPENDENT_AMBULATORY_CARE_PROVIDER_SITE_OTHER): Payer: Medicare Other | Admitting: Pulmonary Disease

## 2022-06-15 ENCOUNTER — Encounter: Payer: Self-pay | Admitting: Pulmonary Disease

## 2022-06-15 VITALS — BP 130/80 | HR 83 | Ht 64.0 in | Wt 188.6 lb

## 2022-06-15 DIAGNOSIS — R918 Other nonspecific abnormal finding of lung field: Secondary | ICD-10-CM

## 2022-06-15 DIAGNOSIS — Z7722 Contact with and (suspected) exposure to environmental tobacco smoke (acute) (chronic): Secondary | ICD-10-CM | POA: Diagnosis not present

## 2022-06-15 DIAGNOSIS — Z87891 Personal history of nicotine dependence: Secondary | ICD-10-CM

## 2022-06-15 DIAGNOSIS — Z801 Family history of malignant neoplasm of trachea, bronchus and lung: Secondary | ICD-10-CM

## 2022-06-15 NOTE — Patient Instructions (Addendum)
Thank you for visiting Dr. Valeta Harms at [facility]. Today we recommend the following:.  Orders Placed This Encounter  Procedures   CT Chest Wo Contrast   Follow up with [place] after your CT is complete.   Return for with Eric Form, NP.    Please do your part to reduce the spread of COVID-19.
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
## 2022-06-25 ENCOUNTER — Encounter (HOSPITAL_COMMUNITY): Payer: Self-pay

## 2022-06-25 ENCOUNTER — Ambulatory Visit (HOSPITAL_COMMUNITY)
Admission: RE | Admit: 2022-06-25 | Discharge: 2022-06-25 | Disposition: A | Discharge status: Home / Self Care | Payer: Medicare Other | Source: Ambulatory Visit | Attending: Pulmonary Disease | Admitting: Pulmonary Disease

## 2022-06-25 DIAGNOSIS — R918 Other nonspecific abnormal finding of lung field: Secondary | ICD-10-CM | POA: Insufficient documentation

## 2022-06-30 NOTE — Progress Notes (Signed)
SG, FYI seeing you on the 2nd. Nodule stable no additional follow up needed.   Thanks,  BLI  Garner Nash, DO [facility] 06/30/2022 7:53 AM

## 2022-07-07 ENCOUNTER — Ambulatory Visit: Payer: Medicare Other | Admitting: Acute Care

## 2022-08-28 DIAGNOSIS — H1010 Acute atopic conjunctivitis, unspecified eye: Secondary | ICD-10-CM | POA: Diagnosis not present

## 2022-09-30 DIAGNOSIS — E1139 Type 2 diabetes mellitus with other diabetic ophthalmic complication: Secondary | ICD-10-CM | POA: Diagnosis not present

## 2023-03-24 ENCOUNTER — Other Ambulatory Visit: Payer: Self-pay | Admitting: Student

## 2023-03-24 DIAGNOSIS — M5416 Radiculopathy, lumbar region: Secondary | ICD-10-CM

## 2023-03-30 ENCOUNTER — Ambulatory Visit
Admission: RE | Admit: 2023-03-30 | Discharge: 2023-03-30 | Disposition: A | Discharge status: Home / Self Care | Payer: Medicare Other | Source: Ambulatory Visit | Attending: Student | Admitting: Student

## 2023-03-30 DIAGNOSIS — M5416 Radiculopathy, lumbar region: Secondary | ICD-10-CM

## 2023-04-22 IMAGING — MR MR HEAD W/O CM
11 series · 48 of 48 positions shown · non-contrast
Comparison: Brain MRI 04/23/2013.

CLINICAL DATA: Memory loss. Additional history provided: Patient
reports memory loss since having COVID in November 2020.

EXAM:
MRI HEAD WITHOUT CONTRAST
TECHNIQUE: Multiplanar, multiecho pulse sequences of the brain and surrounding
structures were obtained without intravenous contrast.

[Series 2: T1 · sagittal · 5.0mm · 0.45mm/px · 1 of 21 slices shown]
[im 1/21]
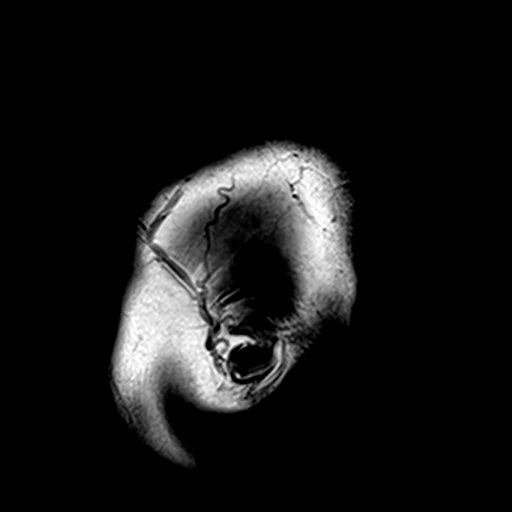

[Series 3: DWI · axial · 3.0mm · 1.80mm/px · z∈[-79,+64]mm · 9 of 98 slices shown (1 of 4)]
[im 1/98]
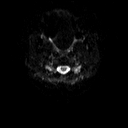
[im 13/98]
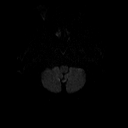
[im 25/98]
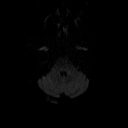
[im 37/98]
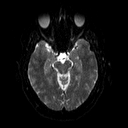
[im 49/98]
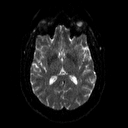
[im 61/98]
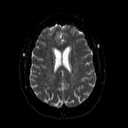
[im 73/98]
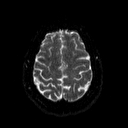
[im 85/98]
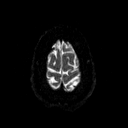
[im 98/98]
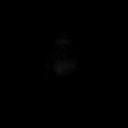

[Series 4: DWI · axial · 3.0mm · 1.80mm/px · z∈[-79,+64]mm · 4 of 49 slices shown (2 of 4)]
[im 1/49]
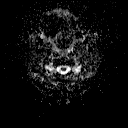
[im 17/49]
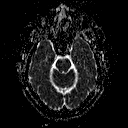
[im 33/49]
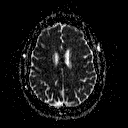
[im 49/49]
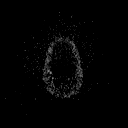

[Series 5: DWI · coronal · 5.0mm · 1.80mm/px · 6 of 66 slices shown (3 of 4)]
[im 1/66]
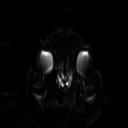
[im 14/66]
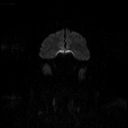
[im 27/66]
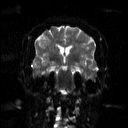
[im 40/66]
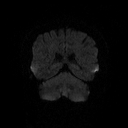
[im 53/66]
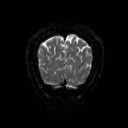
[im 66/66]
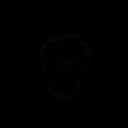

[Series 6: DWI · coronal · 5.0mm · 1.80mm/px · 3 of 34 slices shown (4 of 4)]
[im 1/34]
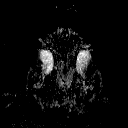
[im 17/34]
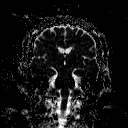
[im 34/34]
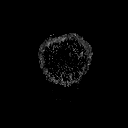

[Series 7: T2 · axial · 5.0mm · 0.51mm/px · z∈[-66,+80]mm · 2 of 22 slices shown (1 of 2)]
[im 1/22]
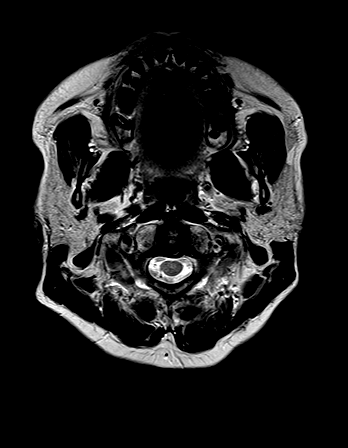
[im 22/22]
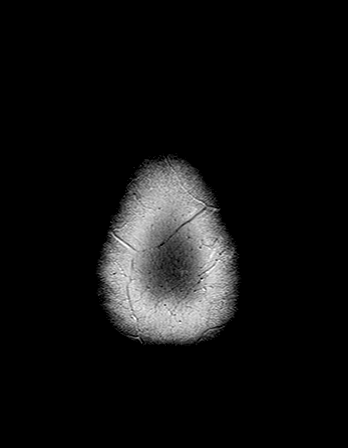

[Series 8: FLAIR · axial · 3.0mm · 0.45mm/px · z∈[-57,+86]mm · 3 of 32 slices shown (1 of 2)]
[im 1/32]
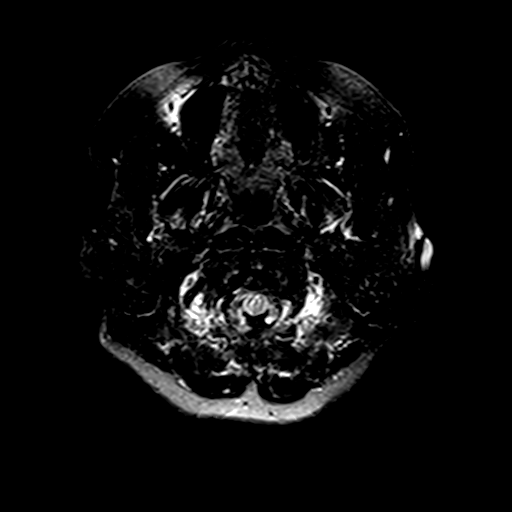
[im 16/32]
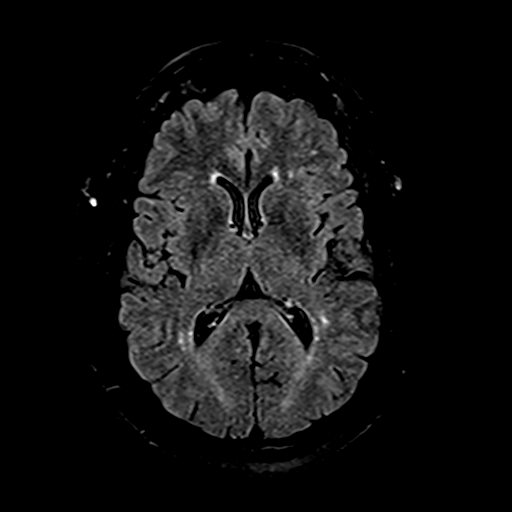
[im 32/32]
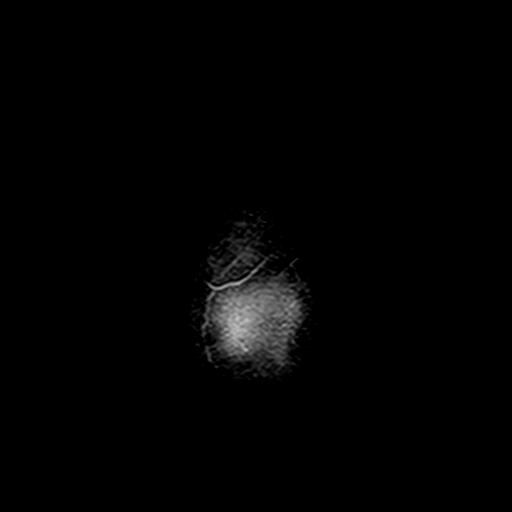

[Series 10: swi_images · axial · 4.0mm · 0.90mm/px · z∈[-62,+77]mm · 3 of 36 slices shown]
[im 1/36]
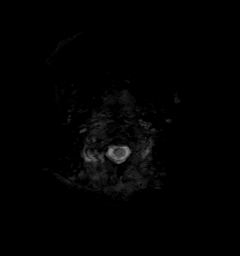
[im 18/36]
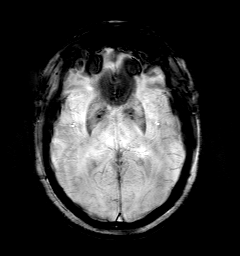
[im 36/36]
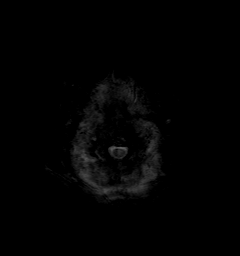

[Series 11: t1_mpr_(person_name) · axial · 1.0mm · 0.71mm/px · z∈[-64,+78]mm · 13 of 144 slices shown]
[im 1/144]
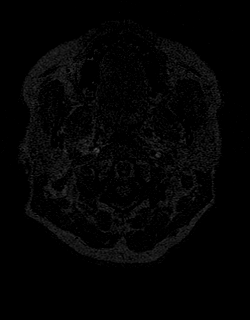
[im 12/144]
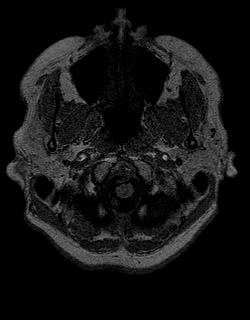
[im 24/144]
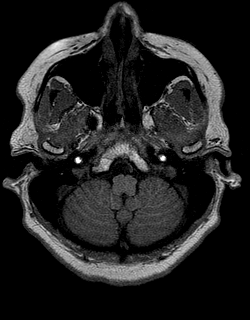
[im 36/144]
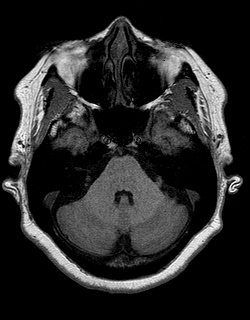
[im 48/144]
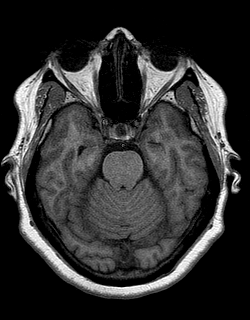
[im 60/144]
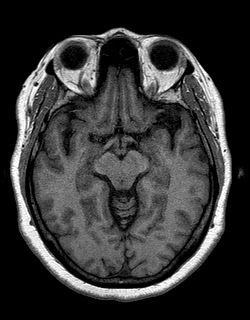
[im 72/144]
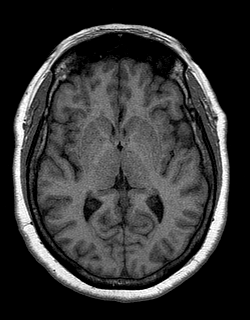
[im 84/144]
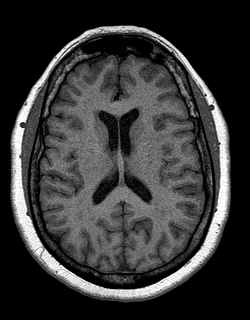
[im 96/144]
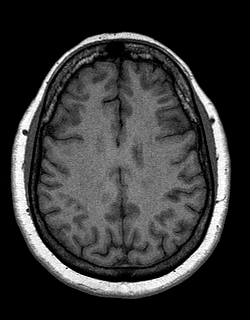
[im 108/144]
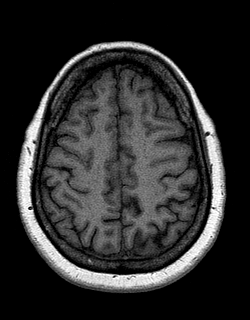
[im 120/144]
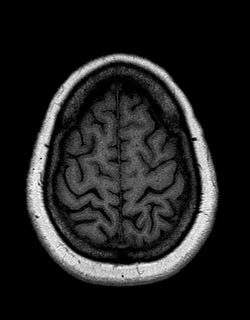
[im 132/144]
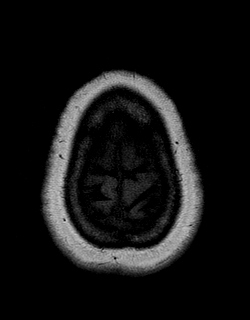
[im 144/144]
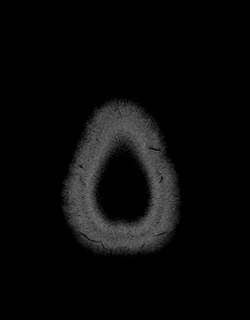

[Series 12: T2 · coronal · 5.0mm · 0.45mm/px · 2 of 24 slices shown (2 of 2)]
[im 1/24]
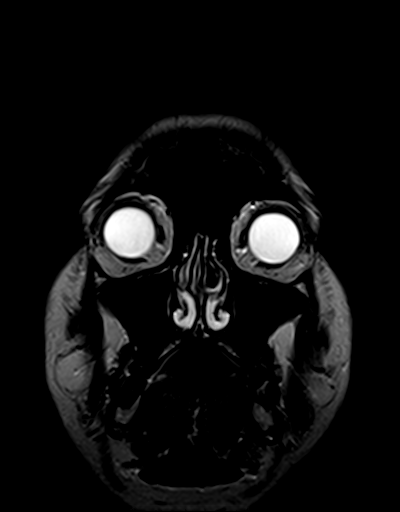
[im 24/24]
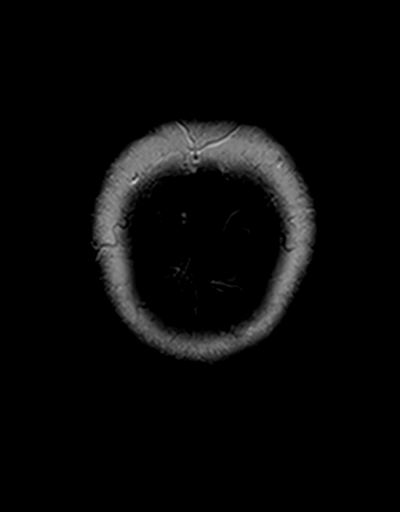

[Series 13: FLAIR · sagittal · 5.0mm · 0.45mm/px · 2 of 25 slices shown (2 of 2)]
[im 1/25]
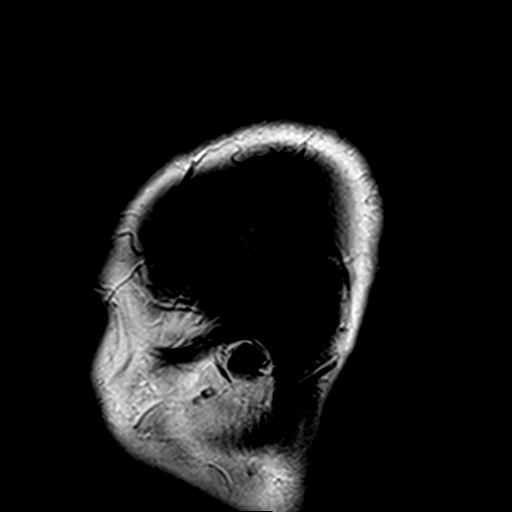
[im 25/25]
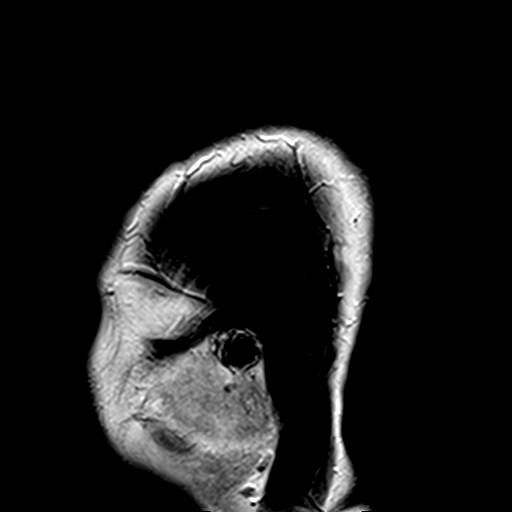

[48 of 48 positions shown; findings below may reference images not displayed]

FINDINGS: Brain:

Cerebral volume is normal.

Lobular extra-axial T1 and T2 hypointense focus measuring 1.5 x
x 1.7 cm overlying the right lateral aspect of the right cerebellar
hemisphere (series 7, image 6) (series 12, image 12). This is
suspicious for a heavily calcified meningioma.

Mild multifocal T2 FLAIR hyperintense signal abnormality within the
cerebral white matter, nonspecific but likely reflecting chronic
small vessel ischemic disease given the patient's history of
diabetes mellitus, hyperlipidemia and hypertension. These signal
changes have progressed as compared to the brain MRI of 04/14/2013.

There is no acute infarct.

No chronic intracranial blood products.

No extra-axial fluid collection.

No midline shift.

Partially empty sella turcica.

Vascular: Maintained flow voids within the proximal large arterial
vessels.

Skull and upper cervical spine: Focal suspicious marrow lesion.

Sinuses/Orbits: Visualized orbits show no acute finding. Trace
mucosal thickening within the bilateral ethmoid air cells.

Other: Trace fluid within the left mastoid air cells.

Impression #2 will be called to the ordering clinician or
representative by the Radiologist Assistant, and communication
documented in the PACS or [REDACTED].
IMPRESSION: Mild multifocal T2 FLAIR hyperintense signal abnormality within the
cerebral white matter, nonspecific but likely reflecting chronic
small vessel ischemic disease given the patient's history of
diabetes mellitus, hyperlipidemia and hypertension. These signal
changes have progressed from the brain MRI of 04/14/2013.

[DATE] x 1.7 cm lobular extra-axial low signal focus overlying the
lateral aspect of the right cerebellar hemisphere. This is
suspicious for a heavily calcified meningioma, and post-contrast MR
imaging of the brain is recommended for further evaluation.

Cerebral volume appears normal for age.

Trace left mastoid effusion.
